# Patient Record
Sex: Female | Born: 1935 | Race: White | Hispanic: No | Marital: Single | Smoking: Former smoker
Health system: Southern US, Community
[De-identification: ages and names within clinical notes are randomized; demographics above are authoritative.]

## PROBLEM LIST (undated history)

## (undated) DIAGNOSIS — I10 Essential (primary) hypertension: Secondary | ICD-10-CM

## (undated) DIAGNOSIS — I639 Cerebral infarction, unspecified: Secondary | ICD-10-CM

## (undated) DIAGNOSIS — D649 Anemia, unspecified: Secondary | ICD-10-CM

## (undated) DIAGNOSIS — Z950 Presence of cardiac pacemaker: Secondary | ICD-10-CM

## (undated) DIAGNOSIS — I359 Nonrheumatic aortic valve disorder, unspecified: Secondary | ICD-10-CM

## (undated) DIAGNOSIS — R42 Dizziness and giddiness: Secondary | ICD-10-CM

## (undated) HISTORY — PX: HIP FRACTURE SURGERY: SHX118

## (undated) HISTORY — PX: OTHER SURGICAL HISTORY: SHX169

## (undated) HISTORY — PX: CAROTID STENT: SHX1301

---

## 2005-09-26 ENCOUNTER — Other Ambulatory Visit: Payer: Self-pay

## 2005-09-26 ENCOUNTER — Inpatient Hospital Stay: Payer: Self-pay | Admitting: Internal Medicine

## 2005-10-01 ENCOUNTER — Encounter: Payer: Self-pay | Admitting: Internal Medicine

## 2005-10-15 ENCOUNTER — Encounter: Payer: Self-pay | Admitting: Internal Medicine

## 2009-10-20 ENCOUNTER — Inpatient Hospital Stay: Payer: Self-pay | Admitting: Internal Medicine

## 2012-10-15 ENCOUNTER — Emergency Department: Payer: Self-pay | Admitting: Emergency Medicine

## 2012-10-15 LAB — URINALYSIS, COMPLETE
Hyaline Cast: 1
Nitrite: NEGATIVE
Protein: 30
WBC UR: 6 /HPF (ref 0–5)

## 2012-10-15 LAB — CK TOTAL AND CKMB (NOT AT ARMC)
CK, Total: 44 U/L (ref 21–215)
CK-MB: <0.5 ng/mL — ABNORMAL LOW (ref 0.5–3.6)

## 2012-10-15 LAB — CBC WITH DIFFERENTIAL/PLATELET
Basophil %: 0.5 %
Eosinophil #: 0.1 10*3/uL (ref 0.0–0.7)
Eosinophil %: 1.2 %
HGB: 11.9 g/dL — ABNORMAL LOW (ref 12.0–16.0)
Lymphocyte #: 1.2 10*3/uL (ref 1.0–3.6)
Lymphocyte %: 15.8 %
MCHC: 35 g/dL (ref 32.0–36.0)
Monocyte #: 0.6 x10 3/mm (ref 0.2–0.9)
Neutrophil #: 5.9 10*3/uL (ref 1.4–6.5)
Neutrophil %: 75.3 %
Platelet: 179 10*3/uL (ref 150–440)
RBC: 3.69 10*6/uL — ABNORMAL LOW (ref 3.80–5.20)
RDW: 13.1 % (ref 11.5–14.5)

## 2012-10-15 LAB — COMPREHENSIVE METABOLIC PANEL
Albumin: 3.3 g/dL — ABNORMAL LOW (ref 3.4–5.0)
BUN: 14 mg/dL (ref 7–18)
Bilirubin,Total: 0.4 mg/dL (ref 0.2–1.0)
Calcium, Total: 8.4 mg/dL — ABNORMAL LOW (ref 8.5–10.1)
Creatinine: 0.94 mg/dL (ref 0.60–1.30)
EGFR (Non-African Amer.): 55 — ABNORMAL LOW
Glucose: 82 mg/dL (ref 65–99)
Osmolality: 281 (ref 275–301)
SGOT(AST): 24 U/L (ref 15–37)
SGPT (ALT): 17 U/L (ref 12–78)
Sodium: 141 mmol/L (ref 136–145)

## 2012-10-15 LAB — TROPONIN I: Troponin-I: <0.02 ng/mL

## 2012-12-24 ENCOUNTER — Inpatient Hospital Stay: Payer: Self-pay | Admitting: Family Medicine

## 2012-12-25 LAB — COMPREHENSIVE METABOLIC PANEL
Anion Gap: 5 — ABNORMAL LOW (ref 7–16)
BUN: 15 mg/dL (ref 7–18)
Bilirubin,Total: 0.4 mg/dL (ref 0.2–1.0)
Calcium, Total: 7.7 mg/dL — ABNORMAL LOW (ref 8.5–10.1)
Chloride: 107 mmol/L (ref 98–107)
Co2: 26 mmol/L (ref 21–32)
EGFR (African American): >60
EGFR (Non-African Amer.): >60
Glucose: 124 mg/dL — ABNORMAL HIGH (ref 65–99)
SGPT (ALT): 15 U/L (ref 12–78)
Sodium: 138 mmol/L (ref 136–145)
Total Protein: 5.2 g/dL — ABNORMAL LOW (ref 6.4–8.2)

## 2012-12-25 LAB — CBC WITH DIFFERENTIAL/PLATELET
Basophil #: 0 10*3/uL (ref 0.0–0.1)
Eosinophil #: 0 10*3/uL (ref 0.0–0.7)
HGB: 8.3 g/dL — ABNORMAL LOW (ref 12.0–16.0)
MCV: 93 fL (ref 80–100)
Monocyte %: 9.6 %
Neutrophil #: 8.1 10*3/uL — ABNORMAL HIGH (ref 1.4–6.5)
Neutrophil %: 73.7 %
RBC: 2.6 10*6/uL — ABNORMAL LOW (ref 3.80–5.20)

## 2012-12-25 LAB — PROTIME-INR
INR: 1.1
Prothrombin Time: 14.4 secs (ref 11.5–14.7)

## 2012-12-26 LAB — HEMOGLOBIN
HGB: 7.5 g/dL — ABNORMAL LOW (ref 12.0–16.0)
HGB: 8.9 g/dL — ABNORMAL LOW (ref 12.0–16.0)

## 2012-12-27 LAB — CBC WITH DIFFERENTIAL/PLATELET
Basophil #: 0 10*3/uL (ref 0.0–0.1)
Basophil %: 0.4 %
Eosinophil #: 0.2 10*3/uL (ref 0.0–0.7)
MCV: 89 fL (ref 80–100)
Monocyte #: 0.8 x10 3/mm (ref 0.2–0.9)
Monocyte %: 8.3 %
Neutrophil %: 80.2 %
Platelet: 102 10*3/uL — ABNORMAL LOW (ref 150–440)
RBC: 2.57 10*6/uL — ABNORMAL LOW (ref 3.80–5.20)
RDW: 15.1 % — ABNORMAL HIGH (ref 11.5–14.5)
WBC: 9.5 10*3/uL (ref 3.6–11.0)

## 2012-12-27 LAB — BASIC METABOLIC PANEL
BUN: 8 mg/dL (ref 7–18)
Creatinine: 0.53 mg/dL — ABNORMAL LOW (ref 0.60–1.30)
EGFR (Non-African Amer.): >60
Glucose: 125 mg/dL — ABNORMAL HIGH (ref 65–99)
Sodium: 134 mmol/L — ABNORMAL LOW (ref 136–145)

## 2012-12-28 LAB — HEMOGLOBIN: HGB: 7.4 g/dL — ABNORMAL LOW (ref 12.0–16.0)

## 2012-12-29 LAB — CBC WITH DIFFERENTIAL/PLATELET
Basophil #: 0 10*3/uL (ref 0.0–0.1)
Basophil %: 0.3 %
Eosinophil %: 3.6 %
HCT: 24.3 % — ABNORMAL LOW (ref 35.0–47.0)
Lymphocyte #: 0.7 10*3/uL — ABNORMAL LOW (ref 1.0–3.6)
Lymphocyte %: 9.4 %
MCHC: 36.3 g/dL — ABNORMAL HIGH (ref 32.0–36.0)
MCV: 87 fL (ref 80–100)
Monocyte #: 0.8 x10 3/mm (ref 0.2–0.9)
Monocyte %: 9.7 %
Neutrophil #: 6 10*3/uL (ref 1.4–6.5)
Neutrophil %: 77 %
Platelet: 145 10*3/uL — ABNORMAL LOW (ref 150–440)
RBC: 2.79 10*6/uL — ABNORMAL LOW (ref 3.80–5.20)
RDW: 15.3 % — ABNORMAL HIGH (ref 11.5–14.5)
WBC: 7.8 10*3/uL (ref 3.6–11.0)

## 2012-12-30 ENCOUNTER — Encounter: Payer: Self-pay | Admitting: Internal Medicine

## 2013-01-15 ENCOUNTER — Encounter: Payer: Self-pay | Admitting: Internal Medicine

## 2013-01-21 LAB — URINALYSIS, COMPLETE
Ketone: NEGATIVE
Ph: 5 (ref 4.5–8.0)
RBC,UR: 2 /HPF (ref 0–5)
Specific Gravity: 1.019 (ref 1.003–1.030)
Squamous Epithelial: 2

## 2013-01-22 LAB — URINE CULTURE

## 2013-02-06 LAB — CBC WITH DIFFERENTIAL/PLATELET
Eosinophil %: 2.5 %
HCT: 37.8 % (ref 35.0–47.0)
HGB: 13.1 g/dL (ref 12.0–16.0)
Lymphocyte #: 1.7 10*3/uL (ref 1.0–3.6)
Lymphocyte %: 25.2 %
MCHC: 34.8 g/dL (ref 32.0–36.0)
MCV: 97 fL (ref 80–100)
Monocyte #: 0.5 x10 3/mm (ref 0.2–0.9)
Monocyte %: 6.7 %
Neutrophil #: 4.3 10*3/uL (ref 1.4–6.5)
Neutrophil %: 64.7 %
RBC: 3.91 10*6/uL (ref 3.80–5.20)
WBC: 6.7 10*3/uL (ref 3.6–11.0)

## 2013-02-14 ENCOUNTER — Encounter: Payer: Self-pay | Admitting: Internal Medicine

## 2013-09-20 ENCOUNTER — Ambulatory Visit: Payer: Self-pay | Admitting: Neurology

## 2013-10-22 ENCOUNTER — Ambulatory Visit: Payer: Self-pay | Admitting: Family Medicine

## 2013-10-30 ENCOUNTER — Emergency Department: Payer: Self-pay | Admitting: Internal Medicine

## 2013-10-30 LAB — CBC WITH DIFFERENTIAL/PLATELET
BASOS ABS: 0.1 10*3/uL (ref 0.0–0.1)
Basophil %: 0.9 %
EOS ABS: 0.2 10*3/uL (ref 0.0–0.7)
EOS PCT: 4.1 %
HCT: 39.6 % (ref 35.0–47.0)
HGB: 13.2 g/dL (ref 12.0–16.0)
LYMPHS ABS: 2.1 10*3/uL (ref 1.0–3.6)
LYMPHS PCT: 35.5 %
MCH: 33.2 pg (ref 26.0–34.0)
MCHC: 33.4 g/dL (ref 32.0–36.0)
MCV: 99 fL (ref 80–100)
Monocyte #: 0.5 x10 3/mm (ref 0.2–0.9)
Monocyte %: 7.6 %
NEUTROS PCT: 51.9 %
Neutrophil #: 3.1 10*3/uL (ref 1.4–6.5)
Platelet: 207 10*3/uL (ref 150–440)
RBC: 3.98 10*6/uL (ref 3.80–5.20)
RDW: 12.5 % (ref 11.5–14.5)
WBC: 6 10*3/uL (ref 3.6–11.0)

## 2013-10-30 LAB — URINALYSIS, COMPLETE
Bacteria: NONE SEEN
Bilirubin,UR: NEGATIVE
Blood: NEGATIVE
Glucose,UR: NEGATIVE mg/dL (ref 0–75)
Ketone: NEGATIVE
LEUKOCYTE ESTERASE: NEGATIVE
NITRITE: NEGATIVE
PH: 6 (ref 4.5–8.0)
Protein: NEGATIVE
RBC,UR: <1 /HPF (ref 0–5)
Specific Gravity: 1.009 (ref 1.003–1.030)
Squamous Epithelial: NONE SEEN

## 2013-10-30 LAB — BASIC METABOLIC PANEL
Anion Gap: 5 — ABNORMAL LOW (ref 7–16)
BUN: 14 mg/dL (ref 7–18)
Calcium, Total: 8.6 mg/dL (ref 8.5–10.1)
Chloride: 103 mmol/L (ref 98–107)
Co2: 28 mmol/L (ref 21–32)
Creatinine: 0.69 mg/dL (ref 0.60–1.30)
EGFR (Non-African Amer.): >60
Glucose: 178 mg/dL — ABNORMAL HIGH (ref 65–99)
OSMOLALITY: 277 (ref 275–301)
Potassium: 4 mmol/L (ref 3.5–5.1)
Sodium: 136 mmol/L (ref 136–145)

## 2013-10-30 LAB — HEMOGLOBIN A1C: HEMOGLOBIN A1C: 5.5 % (ref 4.2–6.3)

## 2013-10-30 LAB — TROPONIN I: Troponin-I: <0.02 ng/mL

## 2013-11-13 ENCOUNTER — Ambulatory Visit: Payer: Self-pay | Admitting: Family Medicine

## 2014-01-18 ENCOUNTER — Inpatient Hospital Stay: Payer: Self-pay | Admitting: Internal Medicine

## 2014-01-18 LAB — COMPREHENSIVE METABOLIC PANEL
ALT: 23 U/L
Albumin: 3.7 g/dL (ref 3.4–5.0)
Alkaline Phosphatase: 68 U/L
Anion Gap: 8 (ref 7–16)
BILIRUBIN TOTAL: 0.3 mg/dL (ref 0.2–1.0)
BUN: 17 mg/dL (ref 7–18)
CO2: 28 mmol/L (ref 21–32)
Calcium, Total: 9 mg/dL (ref 8.5–10.1)
Chloride: 102 mmol/L (ref 98–107)
Creatinine: 0.86 mg/dL (ref 0.60–1.30)
EGFR (African American): >60
EGFR (Non-African Amer.): >60
Glucose: 101 mg/dL — ABNORMAL HIGH (ref 65–99)
Osmolality: 277 (ref 275–301)
Potassium: 4.2 mmol/L (ref 3.5–5.1)
SGOT(AST): 22 U/L (ref 15–37)
SODIUM: 138 mmol/L (ref 136–145)
Total Protein: 7.4 g/dL (ref 6.4–8.2)

## 2014-01-18 LAB — CBC WITH DIFFERENTIAL/PLATELET
Basophil #: 0.1 10*3/uL (ref 0.0–0.1)
Basophil %: 0.9 %
Eosinophil #: 0.2 10*3/uL (ref 0.0–0.7)
Eosinophil %: 1.9 %
HCT: 43.8 % (ref 35.0–47.0)
HGB: 14.4 g/dL (ref 12.0–16.0)
LYMPHS ABS: 3.1 10*3/uL (ref 1.0–3.6)
LYMPHS PCT: 32.9 %
MCH: 33.2 pg (ref 26.0–34.0)
MCHC: 32.9 g/dL (ref 32.0–36.0)
MCV: 101 fL — AB (ref 80–100)
Monocyte #: 0.6 x10 3/mm (ref 0.2–0.9)
Monocyte %: 6.9 %
NEUTROS PCT: 57.4 %
Neutrophil #: 5.4 10*3/uL (ref 1.4–6.5)
Platelet: 240 10*3/uL (ref 150–440)
RBC: 4.35 10*6/uL (ref 3.80–5.20)
RDW: 12.5 % (ref 11.5–14.5)
WBC: 9.4 10*3/uL (ref 3.6–11.0)

## 2014-01-18 LAB — APTT: Activated PTT: 27.4 secs (ref 23.6–35.9)

## 2014-01-18 LAB — URINALYSIS, COMPLETE
BLOOD: NEGATIVE
Bacteria: NONE SEEN
Bilirubin,UR: NEGATIVE
GLUCOSE, UR: NEGATIVE mg/dL (ref 0–75)
KETONE: NEGATIVE
Leukocyte Esterase: NEGATIVE
NITRITE: NEGATIVE
Ph: 6 (ref 4.5–8.0)
Protein: NEGATIVE
RBC,UR: 1 /HPF (ref 0–5)
Specific Gravity: 1.021 (ref 1.003–1.030)
Squamous Epithelial: NONE SEEN
WBC UR: 9 /HPF (ref 0–5)

## 2014-01-18 LAB — PROTIME-INR
INR: 1
Prothrombin Time: 12.9 secs (ref 11.5–14.7)

## 2014-01-18 LAB — TROPONIN I: Troponin-I: <0.02 ng/mL

## 2014-01-18 LAB — LIPASE, BLOOD: LIPASE: 138 U/L (ref 73–393)

## 2014-01-18 LAB — MAGNESIUM: MAGNESIUM: 2.1 mg/dL

## 2014-01-19 DIAGNOSIS — I059 Rheumatic mitral valve disease, unspecified: Secondary | ICD-10-CM

## 2014-01-19 LAB — LIPID PANEL
Cholesterol: 87 mg/dL (ref 0–200)
HDL Cholesterol: 39 mg/dL — ABNORMAL LOW (ref 40–60)
LDL CHOLESTEROL, CALC: 31 mg/dL (ref 0–100)
TRIGLYCERIDES: 84 mg/dL (ref 0–200)
VLDL Cholesterol, Calc: 17 mg/dL (ref 5–40)

## 2014-01-19 LAB — CBC WITH DIFFERENTIAL/PLATELET
BASOS PCT: 1.1 %
Basophil #: 0.1 10*3/uL (ref 0.0–0.1)
Eosinophil #: 0.2 10*3/uL (ref 0.0–0.7)
Eosinophil %: 4 %
HCT: 39.7 % (ref 35.0–47.0)
HGB: 13.2 g/dL (ref 12.0–16.0)
LYMPHS ABS: 1.7 10*3/uL (ref 1.0–3.6)
Lymphocyte %: 33.9 %
MCH: 33.6 pg (ref 26.0–34.0)
MCHC: 33.2 g/dL (ref 32.0–36.0)
MCV: 101 fL — ABNORMAL HIGH (ref 80–100)
MONO ABS: 0.4 x10 3/mm (ref 0.2–0.9)
Monocyte %: 8.5 %
NEUTROS ABS: 2.6 10*3/uL (ref 1.4–6.5)
Neutrophil %: 52.5 %
Platelet: 205 10*3/uL (ref 150–440)
RBC: 3.93 10*6/uL (ref 3.80–5.20)
RDW: 12.5 % (ref 11.5–14.5)
WBC: 5 10*3/uL (ref 3.6–11.0)

## 2014-01-19 LAB — TSH: Thyroid Stimulating Horm: 0.618 u[IU]/mL

## 2014-01-19 LAB — BASIC METABOLIC PANEL
Anion Gap: 5 — ABNORMAL LOW (ref 7–16)
BUN: 9 mg/dL (ref 7–18)
Calcium, Total: 8.7 mg/dL (ref 8.5–10.1)
Chloride: 103 mmol/L (ref 98–107)
Co2: 29 mmol/L (ref 21–32)
Creatinine: 0.62 mg/dL (ref 0.60–1.30)
EGFR (Non-African Amer.): >60
Glucose: 83 mg/dL (ref 65–99)
Osmolality: 272 (ref 275–301)
Potassium: 3.9 mmol/L (ref 3.5–5.1)
Sodium: 137 mmol/L (ref 136–145)

## 2014-01-20 LAB — BASIC METABOLIC PANEL
ANION GAP: 9 (ref 7–16)
BUN: 11 mg/dL (ref 7–18)
CO2: 27 mmol/L (ref 21–32)
Calcium, Total: 8.8 mg/dL (ref 8.5–10.1)
Chloride: 100 mmol/L (ref 98–107)
Creatinine: 0.64 mg/dL (ref 0.60–1.30)
EGFR (Non-African Amer.): >60
Glucose: 93 mg/dL (ref 65–99)
Osmolality: 271 (ref 275–301)
POTASSIUM: 3.6 mmol/L (ref 3.5–5.1)
SODIUM: 136 mmol/L (ref 136–145)

## 2014-01-20 LAB — CBC WITH DIFFERENTIAL/PLATELET
Basophil #: 0.1 10*3/uL (ref 0.0–0.1)
Basophil %: 0.9 %
Eosinophil #: 0.2 10*3/uL (ref 0.0–0.7)
Eosinophil %: 3.6 %
HCT: 41.2 % (ref 35.0–47.0)
HGB: 14.1 g/dL (ref 12.0–16.0)
LYMPHS ABS: 1.4 10*3/uL (ref 1.0–3.6)
LYMPHS PCT: 22.7 %
MCH: 33.8 pg (ref 26.0–34.0)
MCHC: 34.3 g/dL (ref 32.0–36.0)
MCV: 99 fL (ref 80–100)
Monocyte #: 0.5 x10 3/mm (ref 0.2–0.9)
Monocyte %: 8 %
Neutrophil #: 4 10*3/uL (ref 1.4–6.5)
Neutrophil %: 64.8 %
Platelet: 216 10*3/uL (ref 150–440)
RBC: 4.17 10*6/uL (ref 3.80–5.20)
RDW: 12.2 % (ref 11.5–14.5)
WBC: 6.2 10*3/uL (ref 3.6–11.0)

## 2014-03-15 ENCOUNTER — Emergency Department: Payer: Self-pay | Admitting: Emergency Medicine

## 2014-03-15 LAB — BASIC METABOLIC PANEL
Anion Gap: 8 (ref 7–16)
BUN: 16 mg/dL (ref 7–18)
CALCIUM: 8.3 mg/dL — AB (ref 8.5–10.1)
CHLORIDE: 100 mmol/L (ref 98–107)
Co2: 28 mmol/L (ref 21–32)
Creatinine: 0.53 mg/dL — ABNORMAL LOW (ref 0.60–1.30)
EGFR (African American): >60
Glucose: 118 mg/dL — ABNORMAL HIGH (ref 65–99)
Osmolality: 274 (ref 275–301)
Potassium: 4.4 mmol/L (ref 3.5–5.1)
SODIUM: 136 mmol/L (ref 136–145)

## 2014-03-15 LAB — CBC WITH DIFFERENTIAL/PLATELET
Basophil #: 0 10*3/uL (ref 0.0–0.1)
Basophil %: 0.8 %
Eosinophil #: 0.1 10*3/uL (ref 0.0–0.7)
Eosinophil %: 2.2 %
HCT: 37.7 % (ref 35.0–47.0)
HGB: 12.6 g/dL (ref 12.0–16.0)
LYMPHS ABS: 1.5 10*3/uL (ref 1.0–3.6)
Lymphocyte %: 25.3 %
MCH: 34.1 pg — AB (ref 26.0–34.0)
MCHC: 33.5 g/dL (ref 32.0–36.0)
MCV: 102 fL — AB (ref 80–100)
MONOS PCT: 5.7 %
Monocyte #: 0.3 x10 3/mm (ref 0.2–0.9)
Neutrophil #: 3.9 10*3/uL (ref 1.4–6.5)
Neutrophil %: 66 %
Platelet: 235 10*3/uL (ref 150–440)
RBC: 3.7 10*6/uL — ABNORMAL LOW (ref 3.80–5.20)
RDW: 12.4 % (ref 11.5–14.5)
WBC: 5.9 10*3/uL (ref 3.6–11.0)

## 2014-03-15 LAB — URINALYSIS, COMPLETE
BILIRUBIN, UR: NEGATIVE
BLOOD: NEGATIVE
Bacteria: NONE SEEN
GLUCOSE, UR: NEGATIVE mg/dL (ref 0–75)
KETONE: NEGATIVE
Leukocyte Esterase: NEGATIVE
NITRITE: NEGATIVE
PH: 6 (ref 4.5–8.0)
Protein: NEGATIVE
Specific Gravity: 1.014 (ref 1.003–1.030)
Squamous Epithelial: 1

## 2014-03-15 LAB — TROPONIN I

## 2014-05-22 ENCOUNTER — Emergency Department: Payer: Self-pay | Admitting: Emergency Medicine

## 2014-05-22 LAB — COMPREHENSIVE METABOLIC PANEL
ALT: 26 U/L
Albumin: 3.6 g/dL (ref 3.4–5.0)
Alkaline Phosphatase: 69 U/L
Anion Gap: 4 — ABNORMAL LOW (ref 7–16)
BILIRUBIN TOTAL: 0.3 mg/dL (ref 0.2–1.0)
BUN: 19 mg/dL — ABNORMAL HIGH (ref 7–18)
CHLORIDE: 102 mmol/L (ref 98–107)
Calcium, Total: 8.6 mg/dL (ref 8.5–10.1)
Co2: 31 mmol/L (ref 21–32)
Creatinine: 0.63 mg/dL (ref 0.60–1.30)
EGFR (African American): >60
EGFR (Non-African Amer.): >60
Glucose: 94 mg/dL (ref 65–99)
Osmolality: 276 (ref 275–301)
Potassium: 4 mmol/L (ref 3.5–5.1)
SGOT(AST): 33 U/L (ref 15–37)
SODIUM: 137 mmol/L (ref 136–145)
TOTAL PROTEIN: 7.2 g/dL (ref 6.4–8.2)

## 2014-05-22 LAB — URINALYSIS, COMPLETE
Bacteria: NONE SEEN
Bilirubin,UR: NEGATIVE
Blood: NEGATIVE
Glucose,UR: NEGATIVE mg/dL (ref 0–75)
Ketone: NEGATIVE
LEUKOCYTE ESTERASE: NEGATIVE
Nitrite: NEGATIVE
PH: 6 (ref 4.5–8.0)
PROTEIN: NEGATIVE
RBC,UR: 2 /HPF (ref 0–5)
Specific Gravity: 1.012 (ref 1.003–1.030)
Squamous Epithelial: <1
WBC UR: 1 /HPF (ref 0–5)

## 2014-05-22 LAB — CBC
HCT: 40.3 % (ref 35.0–47.0)
HGB: 13.5 g/dL (ref 12.0–16.0)
MCH: 34 pg (ref 26.0–34.0)
MCHC: 33.4 g/dL (ref 32.0–36.0)
MCV: 102 fL — AB (ref 80–100)
Platelet: 222 10*3/uL (ref 150–440)
RBC: 3.96 10*6/uL (ref 3.80–5.20)
RDW: 12.5 % (ref 11.5–14.5)
WBC: 7 10*3/uL (ref 3.6–11.0)

## 2014-05-22 LAB — TROPONIN I: Troponin-I: <0.02 ng/mL

## 2014-08-30 ENCOUNTER — Emergency Department
Admit: 2014-08-30 | Disposition: A | Discharge status: Home / Self Care | Payer: Self-pay | Admitting: Emergency Medicine

## 2014-08-30 LAB — URINALYSIS, COMPLETE
BILIRUBIN, UR: NEGATIVE
Bacteria: NONE SEEN
Blood: NEGATIVE
Glucose,UR: NEGATIVE mg/dL (ref 0–75)
Ketone: NEGATIVE
Nitrite: NEGATIVE
Ph: 7 (ref 4.5–8.0)
Protein: NEGATIVE
SPECIFIC GRAVITY: 1.01 (ref 1.003–1.030)

## 2014-08-30 LAB — CBC
HCT: 38.3 % (ref 35.0–47.0)
HGB: 13 g/dL (ref 12.0–16.0)
MCH: 33.3 pg (ref 26.0–34.0)
MCHC: 33.9 g/dL (ref 32.0–36.0)
MCV: 98 fL (ref 80–100)
PLATELETS: 235 10*3/uL (ref 150–440)
RBC: 3.89 10*6/uL (ref 3.80–5.20)
RDW: 12.7 % (ref 11.5–14.5)
WBC: 6.2 10*3/uL (ref 3.6–11.0)

## 2014-08-30 LAB — COMPREHENSIVE METABOLIC PANEL
ALBUMIN: 4.1 g/dL
ALT: 13 U/L — AB
Alkaline Phosphatase: 63 U/L
Anion Gap: 4 — ABNORMAL LOW (ref 7–16)
BILIRUBIN TOTAL: 0.4 mg/dL
BUN: 12 mg/dL
Calcium, Total: 9 mg/dL
Chloride: 97 mmol/L — ABNORMAL LOW
Co2: 32 mmol/L
Creatinine: 0.62 mg/dL
EGFR (African American): >60
EGFR (Non-African Amer.): >60
GLUCOSE: 97 mg/dL
POTASSIUM: 4.2 mmol/L
SGOT(AST): 22 U/L
SODIUM: 133 mmol/L — AB
TOTAL PROTEIN: 7.2 g/dL

## 2014-08-30 LAB — TROPONIN I

## 2014-09-06 NOTE — Consult Note (Signed)
Brief Consult Note: Diagnosis: right proximal humerus and right proximal femur fractures.   Patient was seen by consultant.   Consult note dictated.   Recommend to proceed with surgery or procedure.   Orders entered.   Comments: will discuss with anesthesia regarding general anesthesia with Plavix. With profound weakness of right UE secondary to remote CVA, will not require tretment beyond sling.  Electronic Signatures: Leitha SchullerMenz, Blossie Raffel J (MD)  (Signed 10-Aug-14 18:09)  Authored: Brief Consult Note   Last Updated: 10-Aug-14 18:09 by Leitha SchullerMenz, Shirlene Andaya J (MD)

## 2014-09-06 NOTE — Consult Note (Signed)
PATIENT NAME:  Denise HarderHILLIARD, Courtany W MR#:  [id number] DATE OF BIRTH:  30-Apr-1924  DATE OF CONSULTATION:  12/24/2012  CONSULTING PHYSICIAN:  Leitha SchullerMichael J. Carizma Dunsworth, MD  REASON FOR CONSULTATION: Right shoulder and hip fractures.   HISTORY OF PRESENT ILLNESS: The patient is an 81 year old [place] [facility] [place] She stumbled. She has a history of prior CVA and drags her right foot. She has to use a hemi-walker in the left hand. Her right arm is fairly flaccid but not used. She had evaluation and noted to have fractures. Because she lives here along with her family, she is transferred [facility] for definitive treatment.   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   PHYSICAL EXAMINATION:  The right arm is flaccid and has mild pain at the proximal humerus. The skin is intact. Right lower extremity is shortened and externally rotated. She does not flex or extend her toes and this is her usual state. Skin is intact with mild swelling to the proximal thigh.   LABORATORY, RADIOLOGIC AND DIAGNOSTIC DATA:  Chest x-ray and hip x-ray from outside institution reveal a somewhat comminuted, high intertrochanteric hip fracture on the right, slightly comminuted proximal humerus fracture on the right.   CLINICAL IMPRESSION:   1.  Proximal humerus fracture in a flaccid upper extremity.  Recommendation is for nonoperative treatment, just a sling.  2.  Regarding her right hip, she has an unstable intertrochanteric hip fracture. Recommended treatment is for an intramedullary device, go with a long rod. However,  with her Plavix, we  will need to check with anesthesia. I suspect we will need to do this under general anesthesia, but in  any case, would like to wait a few days just to minimize the bleeding from the time of surgery since her last Plavix dose was yesterday. We will discuss this  with anesthesia tomorrow.   ____________________________ Leitha SchullerMichael J. Raffi Milstein, MD mjm:cc D: 12/24/2012 19:14:23 ET T: 12/24/2012 21:16:55 ET JOB#: [id number]  cc: Leitha SchullerMichael J. Hilbert Briggs, MD, <Dictator> Leitha SchullerMICHAEL J Darsha Zumstein MD ELECTRONICALLY SIGNED 12/25/2012 14:34

## 2014-09-06 NOTE — Op Note (Signed)
PATIENT NAME:  Denise Calderon, Denise Calderon MR#:  [id number] DATE OF BIRTH:  1923-12-20  DATE OF PROCEDURE:  12/26/2012  PREOPERATIVE DIAGNOSIS: Comminuted intertrochanteric right hip fracture.   POSTOPERATIVE DIAGNOSIS: Comminuted intertrochanteric right hip fracture.   PROCEDURE: Open reduction and internal fixation, right hip, with intramedullary device.   ANESTHESIA: General.   SURGEON: Leitha SchullerMichael J. Kahealani Yankovich, MD.   DESCRIPTION OF PROCEDURE: The patient was brought to the operating room, and after adequate anesthesia was obtained, the left leg was placed in a well-leg legholder. The right leg was placed in a traction boot with traction applied and internal rotation. The C-arm was brought in and good visualization of the fracture was made with near-anatomic alignment. The hip was prepped and draped in the usual method with barrier drape. Appropriate patient identification and timeout procedures were completed.   Approximately a 1-1/2-inch-incision was made proximal to the greater trochanter and a guidewire inserted through the tip of the trochanter in the center on the lateral view. Proximal flexible reamers placed, opening the proximal canal, and the guidewire inserted down to the knee. C-arm views obtained to get rod length.   Next, sequential reaming was carried out with 11 and then 13 mm, with the 13 mm reaming without much resistance. An 11 mm rod was obtained. The Affixus 11 x 340 mm rod, 130 degrees, was then inserted down the canal to the appropriate level.   Lateral incision was then made with the targeting device with a guidewire and centered in a center/center position in the head, measured, and then drilled. A second guidewire was placed superior to this as an antirotation device just for screw insertion. After the drilling and placing the 150 mm lag screw, the guidewires were removed, traction was released, and compression applied through the device. But prior to this, the screw was set proximally to  prevent the screw backing out.   With the screw set and compression applied to the fracture, the insertion handle was removed, permanent AP and lateral of the hip were obtained, and then an AP distally of the femur showing good rod length. The wounds were irrigated and then closed with #1 Vicryl deep, 2-0 Vicryl subcutaneously and skin staples. Xeroform, 4 x 4's, ABD, and tape applied. The patient was sent to the recovery room in stable condition.   ESTIMATED BLOOD LOSS: 200.   COMPLICATIONS: None.   SPECIMEN: None.   IMPLANT: Affixus 130-degree 11 x 340 mm rod with 150 mm lag screw.    ____________________________ Leitha SchullerMichael J. Fern Canova, MD mjm:np D: 12/26/2012 16:46:17 ET T: 12/26/2012 17:18:26 ET JOB#: [id number]  cc: Leitha SchullerMichael J. Markeis Allman, MD, <Dictator> Leitha SchullerMICHAEL J Saphronia Ozdemir MD ELECTRONICALLY SIGNED 12/27/2012 13:55

## 2014-09-06 NOTE — H&P (Signed)
PATIENT NAME:  Denise HarderHILLIARD, Roux W MR#:  [id number] DATE OF BIRTH:  Oct 17, 1923  DATE OF ADMISSION:  12/24/2012  REFERRING PHYSICIAN: Dr. Rosita KeaMenz FAMILY PHYSICIAN: Dr. Larwance SachsBabaoff    REASON FOR ADMISSION: Fall with hip fracture.   HISTORY OF PRESENT ILLNESS: The patient is an 81 year old female with a significant history of peripheral vascular disease, status post stenting in the left carotid artery, has previous right body stroke as well as aortic valve replacement and pacemaker placement 2001, got up this morning to go to the bathroom where she stumbled and fell, injuring her right side. She was brought to the Emergency Room where she was found to have a right shoulder fracture as well as a right hip fracture. She is now admitted for further evaluation. The patient was given pain medications in the Emergency Room. She is without significant pain unless she moves.   PAST MEDICAL HISTORY: 1.  Previous right body stroke.  2.  Peripheral vascular disease, status post left carotid stenting.  3.  Benign hypertension.  4.  Hyperlipidemia.  5.  Osteoarthritis.  6.  Gastroesophageal reflux disease.  7.  Osteoporosis.  8.  Goiter.  9.  Meniere's disease with chronic dizziness.  10.  ASCVD.  11.  Status post aortic valve replacement.  12.  Status post pacemaker placement.   MEDICATIONS: 1.  Os-Cal D 1 p.o. daily.  2.  Lipitor 20 mg p.o. daily.  3.  Fosamax 70 mg p.o. q. week. 4.  Lotrel 2.5/10, 1 p.o. daily.  5.  Plavix 75 mg p.o. daily.  6.  Protonix 40 mg p.o. daily.  7.  Nasonex 2 puffs in each nostril daily.  8.  Meclizine 25 mg p.o. every 6 hours p.r.n. dizziness.   ALLERGIES: No known drug allergies.   SOCIAL HISTORY: Negative for alcohol or tobacco abuse.   FAMILY HISTORY: Positive for hypertension and stroke.   REVIEW OF SYSTEMS:   CONSTITUTIONAL: No fever or change in weight.  EYES: No blurred or double vision. No glaucoma.  ENT: No tinnitus or hearing loss. No nasal discharge or  bleeding. No difficulty swallowing.  RESPIRATORY: No cough or wheezing. Denies hemoptysis. No painful respiration.  CARDIOVASCULAR: No chest pain or orthopnea. No palpitations or syncope.  GASTROINTESTINAL: No nausea, vomiting or diarrhea. No abdominal pain. No change in bowel habits.  GENITOURINARY: No dysuria or hematuria. No incontinence.  ENDOCRINE: No polyuria or polydipsia. No heat or cold intolerance.  HEMATOLOGIC: The patient denies anemia, easy bruising or bleeding.  LYMPHATIC: No swollen glands.  MUSCULOSKELETAL: The patient has pain in her hip and shoulder but otherwise no pain. Denies gout.  NEUROLOGIC: No numbness or weakness. Denies migraines or seizures.  PSYCHIATRIC: The patient denies anxiety, insomnia or depression.   PHYSICAL EXAMINATION: GENERAL: The patient is in no acute distress.  VITAL SIGNS: Vital signs are currently remarkable for a blood pressure of 119/63, with a heart rate of 89, and a respiratory rate of 17. She is afebrile. Sats 97% on room air.  HEENT: Normocephalic, atraumatic. Pupils are equally round and reactive to light and accommodation. Extraocular movements are intact. Sclerae are anicteric. Conjunctivae are clear. Oropharynx is clear.   NECK: Supple without JVD. Bilateral carotid bruits are noted. No adenopathy is present. Mild diffuse thyromegaly is present.  LUNGS: Clear to auscultation and percussion without wheezes, rales or rhonchi. No dullness. Respiratory effort is normal.  CARDIAC: Regular rate and rhythm with a normal S1 and S2. No significant rubs or gallops are present. There  is a systolic murmur noted at the apex.  ABDOMEN: Soft, nontender, with normoactive bowel sounds. No organomegaly or masses were appreciated. No hernias or bruits were noted.  EXTREMITIES: Without clubbing, cyanosis or edema. Pulses were 2+ bilaterally.  SKIN: Warm and dry without rash or lesions.  NEUROLOGIC: Revealed cranial nerves II through XII grossly intact. Deep  tendon reflexes were symmetric. Motor and sensory exam was nonfocal.  PSYCHIATRIC: Revealed a patient who was alert and oriented to person, place, and time. She was cooperative and used good judgment.   LABORATORY DATA: From [facility] Emergency Room reveals a white count of 11.0 with a hemoglobin of 10.9, platelet count of 170,000. Her pro time was 14.2. Urinalysis was unremarkable. Glucose 101 with a BUN of 14, creatinine of 0.65, with a sodium of 144 and a potassium of 3.8.   ASSESSMENT: 1.  Right hip fracture.  2.  Right shoulder fracture.  3.  Anemia of chronic disease.  4.  Peripheral vascular disease, status post left carotid stent placement.  5.  Previous right body stroke.  6.  Benign hypertension.  7.  Hyperlipidemia.  8.  Status post aortic valve replacement.  9.  Status post permanent pacemaker placement.  10.  Meniere's disease with dizziness.   PLAN: The patient will be admitted to Orthopedics, started on IV fluids and empiric IV antibiotics. We will consult Orthopedics. We will place a Foley catheter. We will hold her Plavix and begin Lovenox. We will continue her other outpatient regimen. We will obtain a preop EKG and chest x-ray. Follow up routine labs in the morning. Further treatment and evaluation will depend upon the patient's progress.        TOTAL TIME SPENT: 50 minutes.     ____________________________ Duane Lope Judithann Sheen, MD jds:cb D: 12/24/2012 17:00:32 ET T: 12/24/2012 20:12:08 ET JOB#: [id number]  cc: Duane Lope. Judithann Sheen, MD, <Dictator> Reola Mosher. Randa Lynn, MD Tanique Matney Rodena Medin MD ELECTRONICALLY SIGNED 12/25/2012 7:52

## 2014-09-06 NOTE — Discharge Summary (Signed)
PATIENT NAME:  Denise HarderHILLIARD, Alicja W MR#:  [id number] DATE OF BIRTH:  1924-04-04  DATE OF ADMISSION:  12/24/2012 DATE OF DISCHARGE:  12/29/2012  REASON FOR ADMISSION:  1.  Status post fall sustaining a right hip fracture and right shoulder fracture.  2.  History of anemia of chronic disease on top of acute blood loss anemia status post 2 units of transfused of blood.   DISCHARGE DIAGNOSES: 1.  Right hip fracture status post open reduction and internal fixation.  2.  Right shoulder fracture, shoulder in sling.  3.  Acute blood loss anemia on top of chronic disease anemia.  4.  Peripheral vascular disease.  5.  Previous right-sided stroke.  6.  Hypertension.  7.  Hyperlipidemia.  8.  History of aortic valve replacement.  9.  Permanent pacemaker placement.  10.  Meniere disease.   DISCHARGE MEDICATIONS: 1.  Fosamax 70 mg daily. 2.  Amlodipine with benazepril 2.5/10 mg once a day. 3.  Calcium plus vitamin D once daily. 4.  Plavix 75 mg daily. 5.  Nasonex 50 mg 2 sprays twice daily. 6.  Ibuprofen 200 mg every 4 hours as needed for pain. 7.  Meclizine 25 mg as needed for dizziness. 8.  Atorvastatin 20 mg once a day. 9.  Tylenol extra-strength as needed for pain. 10.  Ferrous sulfate 325 mg 2 times daily. 11.  Senna 1 every 2 hours as needed. 12.  Multivitamin once daily. 13.  Tramadol 50 mg every 6 hours as needed for pain.   DISCHARGE FOLLOWUP:  With Dr. Alonna BucklerAndrew Lamb and Dr. Kennedy BuckerMichael Menz.   PROCEDURES: Diagnosis of comminuted intertrochanteric right hip fractures status post open reduction and internal fixation of right hip with intramedullary device. Surgeon: Dr. Kennedy BuckerMichael Menz. Date of procedure: 12/26/2012.   IMPORTANT LABORATORY AND DIAGNOSTICS: Creatinine 0.5, sodium 134, glucose 125, hemoglobin on admission 8.3 down to 7.5, transfused 1 unit, improved to 8.9, dropped down to 7.4 within 24 hours for what another unit was transfused.  Discharge hemoglobin 8.8. Platelets 145. They were  down to 100.2.   EKG: The patient with dual pacemaker.   HOSPITAL COURSE: An 81 year old female with history of multiple medical problems including bioprosthetic valve replacement of the aortic valve and hypertension, status post hip fracture and femoral fracture on the right side, comes on 12/24/2012 after sustaining a fall.  The patient has history of status post stent of the left carotid after having a CVA that left her right side weak. The patient apparently got up to the bathroom in the morning of the 10th. She stumble and fell on the floor sustaining a hit on her right shoulder and right hip. The patient was elevated in the Emergency Department where she was found to have a comminuted right hip fracture. The patient was cleared for surgery, taken down to the OR on 12/26/2012. Open reduction and internal fixation with intramedullary device was done. The patient recovered very well. The patient was kept here with rehabilitation services and she did well. She is able to be discharged to a skilled nursing facility for further recovery. As far as her other medical issues, the patient has an aortic valve replacement with bovine bioprosthetic valve.  She has history of CVA, which she is stable. She had a stent put in her carotid artery which also has been stable. The patient is taking Plavix. She has anemia which is due to acute blood loss. She required 2 units of blood being transfused after her surgery. At this  moment there are no signs of active bleeding or expanding hematomas for what it is safe to continue antiplatelet therapy with Plavix. Ferritin has been a little bit high for what no iron IV therapy was recommended. Will continue p.o. iron at home.  TIME SPENT:  About 40 minutes with this discharge today. ____________________________ Felipa Furnace, MD rsg:sb D: 12/29/2012 11:48:22 ET T: 12/29/2012 13:11:38 ET JOB#: [id number]  cc: Felipa Furnace, MD, <Dictator> Reola Mosher.  Randa Lynn, MD Leitha Schuller, MD Regan Rakers Juanda Chance MD ELECTRONICALLY SIGNED 12/30/2012 15:30

## 2014-09-07 NOTE — Discharge Summary (Signed)
PATIENT NAME:  Denise Calderon, Denise Calderon MR#:  [id number] DATE OF BIRTH:  [birth year]  DATE OF ADMISSION:  01/18/2014 DATE OF DISCHARGE:  01/20/2014  ADMITTING PHYSICIAN: Shreyang H. Allena Katz, M.D.   DISCHARGE DIAGNOSES: 1. Syncopal event.  2. History of cerebrovascular accident with right-sided weakness.  3. Generalized weakness. 4. Macrocytosis.  5. Gastroesophageal reflux disease.  6. Hypertension.  7. Hyperlipidemia.  8. Do Not Resuscitate.  9. Chronic diastolic dysfunction.  10. Mildly elevated pulmonary artery systolic pressure is seen on 2-D echocardiogram.   CONSULTANTS: None.   PROCEDURES:  1. CT scan of the head shows no acute intracranial abnormality. Old left basal ganglia infarct. Chronic microvascular changes. 2. Bilateral carotid ultrasound shows stent noted extending from the distal portion of the right common carotid artery and into proximal right internal carotid. No hemodynamically significant stenosis. Mild/moderate eccentric calcified plaque noted in the left carotid bulb with less than 50% diameter stenosis.  3. A 2-D echocardiogram showed left ventricular ejection fraction 50% to 60%. Normal global left ventricular function. Impaired relaxation pattern of the LV diastolic filling. Mild concentric left ventricular hypertrophy. Mildly elevated pulmonary artery systolic pressure.   BRIEF HISTORY OF PRESENT ILLNESS: This very pleasant, 81 year old female, with history of CVA with chronic right-sided hemiparesis, peripheral vascular disease, hypertension, hyperlipidemia, and pacemaker went for a followup visit with Dr. Rosita Kea after carpal tunnel release surgery. During her office visit, she slumped over and was not responding for several seconds. The patient reports that she could hear what is being said to her, but felt as if she could not move. When the episode was over, she was back to her baseline mental function and strength. She was transferred to the Emergency Room for further  evaluation.   HOSPITAL COURSE BY PROBLEM: 1. Syncopal event: Described as a period of nonresponsiveness with open eyes, intact posture and awareness of her surroundings. There are no sequelae. The son and daughter feel that she is at her baseline level of function and cognition. CT head is negative for acute change. Carotid Dopplers did not show significant stenosis. A 2-D echocardiogram does not show any significant valvular abnormality which would be contribute to a syncopal event. She is now back on Plavix, which had been held for wrist surgery. Urinalysis is negative for signs of infection. Telemetry negative for arrhythmia. It is possible she may have had a recurrent stroke, no MRI has been performed due to cardiac pacemaker. This is more likely a TIA versus syncopal event. She does not have any new neurologic deficits.  2. History of CVA with right-sided weakness: Right-sided weakness is stable.  3. Generalized weakness: The patient reports that she has been receiving physical therapy at the [facility] and will need to continue with physical therapy. She reports that she feels generally weaker than she did a week ago.  4. Peripheral vascular disease: Continue with statin, Plavix. Carotid Doppler shows stent to be patent.  5. Hypertension. Blood pressure well controlled in hospital on benazepril.  6. Gastroesophageal reflux disease: Continue Protonix.  7. Diastolic dysfunction as seen on 2-D echocardiogram during this admission: She showed no signs of congestive heart failure throughout the admission. 8. Mildly increased pulmonary artery pressure seen on 2-D echocardiogram.    DISCHARGE PHYSICAL EXAMINATION: VITAL SIGNS: Temperature 97.5, pulse 70, respirations 18, blood pressure 147/75, oxygen saturation 97% on room air.  GENERAL: No acute distress.  PULMONARY: Lungs are clear to auscultation bilaterally with good air movement.  CARDIOVASCULAR: No regular rate and rhythm, no murmurs,  rubs, or  gallops.  ABDOMEN: Soft, nontender, nondistended. No guarding, no rebound, no mass.  EXTREMITIES: No swollen or tender joints.  NEUROLOGIC: She does have decreased strength in the right upper and lower extremity, as well as a facial droop. She is alert and oriented x 4.  PSYCHIATRIC: The patient is alert and oriented x 4 with good insight.   LABORATORY DATA: White blood cells 6.2, hemoglobin 14.1, platelets 216,000, MCV 99. Sodium 136, potassium 3.6, chloride 100, bicarbonate 27, BUN 11, creatinine 0.64, glucose 93, total cholesterol 87, HDL 39, triglycerides 84. Liver function panel normal. TSH 0.61. Urinalysis negative for signs of infection.   DISCHARGE MEDICATIONS:  1. Calcium and vitamin D 600 mg/400 international units 1 tablet once a day.  2. Pantoprazole 40 mg 1 tablet once a day.  3. Alendronate 70 mg 1 tablet once a week.  4. Benazepril 10 mg 1 tablet once a day.  5. Clopidogrel 75 mg 1 tablet once a day.  6. Atorvastatin 20 mg 1 tablet once a day.  7. Voltaren topical 1% topical gel, apply to affected area 4 times a day as needed.  8. Refresh ophthalmic solution 1 drop to each eye 4 times a day. 9. Desonide topical 0.05% topical cream apply topically to affected area 2 times a day.  10. Acetaminophen 500 mg 2 tablets orally 3 times a day as needed for pain.  11. Flonase 50 mcg/inhalation nasal spray 2 sprays nasally once a day.  12. Oxybutynin 5 mg 1 tablet once a day.  13. Meclizine 25 mg 1 tablet orally 8 hours as needed.  14. Norco 325 mg/5 mg oral tablet 1 tablet every 6 hours as needed.   DISCHARGE INSTRUCTIONS: The patient [facility]. She will follow up with her primary care physician within the next 1 to 2 weeks.   DIET: As per prior orders. No changes to diet orders.   ACTIVITY: As tolerated.   TIME SPENT ON DISCHARGE: 45 minutes.    ____________________________ Ena Dawleyatherine P. Clent RidgesWalsh, MD cpw:JT D: 01/20/2014  11:54:28 ET T: 01/20/2014 12:35:48 ET JOB#: [id number]  cc: Santina Evansatherine P. Clent RidgesWalsh, MD, <Dictator> Gale JourneyATHERINE P WALSH MD ELECTRONICALLY SIGNED 01/23/2014 12:39

## 2014-09-07 NOTE — H&P (Signed)
PATIENT NAME:  Denise Calderon, Denise Calderon MR#:  [id number] DATE OF BIRTH:  07-21-1923  DATE OF ADMISSION:  01/18/2014  PRIMARY CARE PROVIDER: Dr. Larwance SachsBabaoff.   EMERGENCY DEPARTMENT REFERRING PHYSICIAN: Dr. York CeriseForbach.    HISTORY OF PRESENT ILLNESS: The patient is a pleasant 81 year old white female with previous history of CVA who has chronic right-sided hemiparesis, who had carpal tunnel surgery done recently by Dr. Rosita KeaMenz and went for a followup today in his office. The patient is chronically on Plavix and the Plavix was held preprocedure. According to daughter, this has been held for 1 week and was restarted yesterday. The patient was doing well postoperatively and has not had any issues. She chronically is in a wheelchair and is nonambulatory. She went for a followup in Dr. Neomia GlassMenz's office where she was seen by the nurse and had the bandage taken off and then subsequently the patient's head slumped forward and would not respond. Dr. Rosita KeaMenz came inside and evaluated the patient and brought her to the ED here. The patient subsequently slowly started communicating. She reports that she could hear what was happening, but was not able to interact. She is feeling very fatigued and tired. She is not having any fevers, chills. No chest pains. No shortness of breath. No nausea, vomiting, or diarrhea. Her blood glucose was 101 on the BMP.    PAST MEDICAL HISTORY:  1.  History of peripheral vascular disease 2.  History of CVA with chronic right-sided hemiparesis.  3.  History of chronic anemia.  4.  History of hypertension.  5.  Hyperlipidemia.  6.  History of aortic valve replacement.   7.  Osteoarthritis.  8.  GERD.  9.  History of goiter.  10.   Osteoporosis.  11.   Meniere's disease with chronic dizziness.  12.   Status post aortic valve placement.  13.   Status post pacemaker placement.  14.   Status post right hip fracture open reduction and internal fixation.  15.   Status post right shoulder fracture with  previous sling without any surgery.   ALLERGIES: None.   MEDICATIONS: Voltaren topically applied to affected area 4 times a day, Systane 1 drop to each affected eye 3 times a day, Refresh eye drops to affected eye 4 times a day, Protonix 40 daily, oxybutynin 5 mg 1 tab p.o. t.i.d., meclizine 25 mg 1 tab p.o. q. 6 hours as needed, desonide applied topically to affected area 2 times a day, Plavix 75 p.o. daily, calcium 600 plus vitamin D 1 tab p.o. daily, benazepril 10 daily, atorvastatin 20 daily, alendronate 70 mg once a week, Tylenol 1000 mg at bedtime as needed.   SOCIAL HISTORY: Does not smoke. Does not drink. No drugs currently. Resides at [facility]  assisted living facility.   FAMILY HISTORY: Positive for hypertension.   REVIEW OF SYSTEMS: CONSTITUTIONAL: Denies any fevers. Complains of fatigue, weakness. No pain. No weight loss, weight gain.  EYES: No blurred or double vision. No pain. No redness. No inflammation.  ENT: No tinnitus. No ear pain. No hearing loss. No seasonal or year-round allergies. No epistaxis. No difficulty swallowing.  RESPIRATORY: Denies any cough, wheezing, hemoptysis. No dyspnea. No asthma. No painful respiration. No COPD. No TB.  CARDIOVASCULAR: Denies any chest pain, orthopnea, edema. History of pacemaker placement. No syncope.  GASTROINTESTINAL: No nausea, vomiting, diarrhea. No abdominal pain. No hematemesis. No melena. No ulcer. Has a history of GERD.  GENITOURINARY: Denies any dysuria, hematuria, renal calculus, or frequency.  ENDOCRINE: Denies  any polyuria, nocturia, thyroid problems.  HEMATOLOGIC AND LYMPHATIC: Has a history of chronic anemia. No easy bruisability.  SKIN: No acne. No rash.  MUSCULOSKELETAL: Pain related to osteoarthritis.  NEUROLOGIC: Has a history of CVA with right-sided chronic hemiparesis.  PSYCHIATRIC: Denies any anxiety, insomnia.   PHYSICAL EXAMINATION: VITAL SIGNS: Temperature 98.9, pulse 70, respirations 16, blood pressure  124/55, O2 of 96%.  GENERAL: The patient is a thin female in no acute distress. Appears very weak.  HEENT: Head atraumatic, normocephalic. Pupils equally round, reactive to light and accommodation. There is no conjunctival pallor. No sclerae icterus. Nasal exam shows no drainage or ulceration. External ear exam shows no erythema or drainage. OROPHARYNX: Clear without any exudate.  NECK: Supple without any JVD.  CARDIOVASCULAR: Regular rate and rhythm. No murmurs, rubs, clicks, or gallops.  LUNGS: Clear to auscultation bilaterally without any rales, rhonchi, wheezing. No associated muscle usage.  ABDOMEN: Soft, nontender, nondistended. Positive bowel sounds x 4. No hepatosplenomegaly. EXTREMITIES: No clubbing, cyanosis, or edema.  SKIN: No rash.  LYMPHATICS: No lymph nodes palpable.  VASCULAR: Good DP, PT pulses.  PSYCHIATRIC: Not anxious or depressed.  NEUROLOGIC: Cranial nerves II through XII grossly appear intact. Strength is right-sided 2/5 in the upper and lower extremity. Left upper extremity and left lower extremity is 4/5. The Babinski is downgoing.  LYMPH NODES: Nonpalpable.  MUSCULOSKELETAL: There is no erythema or swelling.   LABORATORY DATA: Glucose 101, BUN 17, creatinine 0.86, sodium 138, potassium 4.2, chloride 102, CO2 is 28. Magnesium 2.1. LFTs are normal. Troponin less than 0.02. WBC 9.4, hemoglobin 14.4, platelet count 240, INR is 1.0. CT scan of the head without contrast shows no acute intracranial abnormality. EKG shows a ventricular paced rhythm.   ASSESSMENT AND PLAN: The patient is an 81 year old white female with history of CVA, chronic right-sided hemiparesis, peripheral vascular disease, status post carotid endarterectomy, hypertension, hyperlipidemia, and pacemaker who is being admitted with acute episode of decrease in responsiveness lasting 45 minutes.  1.  Possible CVA, possible syncope. CT scan negative. We will continue Plavix. Will place on telemetry to make sure  she does not have any tachycardia-induced arrhythmia. Will get an echocardiogram of the heart and check carotids. We will follow her mental status with neurological checks.  2.  Gastroesophageal reflux disease. Will continue Protonix.  3.  Hypertension. Will continue benazepril.   4.  Hyperlipidemia. Continue simvastatin, fasting lipid panel in the a.m.  5.  Chronic dizziness. Will continue meclizine as taking at home.  6.  Miscellaneous. She will be on Lovenox for deep vein thrombosis prophylaxis.   CODE STATUS: Discussed with the patient and the daughter. They wish her to be DNR.  Not interested in chest compression, intubation, or resuscitation.   TIME SPENT ON THIS PATIENT: 55 minutes.    ____________________________ Lacie Scotts Allena Katz, MD shp:at D: 01/18/2014 11:42:41 ET T: 01/18/2014 12:57:16 ET JOB#: [id number]  cc: Joliana Claflin H. Allena Katz, MD, <Dictator> Charise Carwin MD ELECTRONICALLY SIGNED 02/01/2014 8:35

## 2015-04-30 ENCOUNTER — Emergency Department: Payer: Medicare Other

## 2015-04-30 ENCOUNTER — Encounter: Payer: Self-pay | Admitting: Emergency Medicine

## 2015-04-30 ENCOUNTER — Observation Stay
Admission: EM | Admit: 2015-04-30 | Discharge: 2015-05-01 | Disposition: A | Discharge status: Skilled Nursing Facility | Payer: Medicare Other | Attending: Internal Medicine | Admitting: Internal Medicine

## 2015-04-30 DIAGNOSIS — R32 Unspecified urinary incontinence: Secondary | ICD-10-CM | POA: Insufficient documentation

## 2015-04-30 DIAGNOSIS — R61 Generalized hyperhidrosis: Secondary | ICD-10-CM | POA: Diagnosis present

## 2015-04-30 DIAGNOSIS — M4856XA Collapsed vertebra, not elsewhere classified, lumbar region, initial encounter for fracture: Secondary | ICD-10-CM | POA: Diagnosis not present

## 2015-04-30 DIAGNOSIS — R42 Dizziness and giddiness: Secondary | ICD-10-CM

## 2015-04-30 DIAGNOSIS — Z66 Do not resuscitate: Secondary | ICD-10-CM | POA: Diagnosis not present

## 2015-04-30 DIAGNOSIS — Z952 Presence of prosthetic heart valve: Secondary | ICD-10-CM | POA: Diagnosis not present

## 2015-04-30 DIAGNOSIS — N390 Urinary tract infection, site not specified: Secondary | ICD-10-CM

## 2015-04-30 DIAGNOSIS — F0390 Unspecified dementia without behavioral disturbance: Secondary | ICD-10-CM | POA: Diagnosis not present

## 2015-04-30 DIAGNOSIS — K219 Gastro-esophageal reflux disease without esophagitis: Secondary | ICD-10-CM | POA: Diagnosis not present

## 2015-04-30 DIAGNOSIS — Z8673 Personal history of transient ischemic attack (TIA), and cerebral infarction without residual deficits: Secondary | ICD-10-CM | POA: Insufficient documentation

## 2015-04-30 DIAGNOSIS — Z8249 Family history of ischemic heart disease and other diseases of the circulatory system: Secondary | ICD-10-CM | POA: Insufficient documentation

## 2015-04-30 DIAGNOSIS — M1990 Unspecified osteoarthritis, unspecified site: Secondary | ICD-10-CM | POA: Diagnosis not present

## 2015-04-30 DIAGNOSIS — F329 Major depressive disorder, single episode, unspecified: Secondary | ICD-10-CM | POA: Diagnosis not present

## 2015-04-30 DIAGNOSIS — R918 Other nonspecific abnormal finding of lung field: Secondary | ICD-10-CM | POA: Diagnosis not present

## 2015-04-30 DIAGNOSIS — I10 Essential (primary) hypertension: Secondary | ICD-10-CM | POA: Insufficient documentation

## 2015-04-30 DIAGNOSIS — R4182 Altered mental status, unspecified: Principal | ICD-10-CM

## 2015-04-30 DIAGNOSIS — Z950 Presence of cardiac pacemaker: Secondary | ICD-10-CM | POA: Diagnosis not present

## 2015-04-30 HISTORY — DX: Anemia, unspecified: D64.9

## 2015-04-30 HISTORY — DX: Presence of cardiac pacemaker: Z95.0

## 2015-04-30 HISTORY — DX: Cerebral infarction, unspecified: I63.9

## 2015-04-30 HISTORY — DX: Essential (primary) hypertension: I10

## 2015-04-30 LAB — URINALYSIS COMPLETE WITH MICROSCOPIC (ARMC ONLY)
BILIRUBIN URINE: NEGATIVE
GLUCOSE, UA: NEGATIVE mg/dL
HGB URINE DIPSTICK: NEGATIVE
Ketones, ur: NEGATIVE mg/dL
Nitrite: NEGATIVE
Protein, ur: NEGATIVE mg/dL
Specific Gravity, Urine: 1.013 (ref 1.005–1.030)
pH: 6 (ref 5.0–8.0)

## 2015-04-30 LAB — COMPREHENSIVE METABOLIC PANEL
ALK PHOS: 76 U/L (ref 38–126)
ALT: 14 U/L (ref 14–54)
ANION GAP: 6 (ref 5–15)
AST: 21 U/L (ref 15–41)
Albumin: 3.7 g/dL (ref 3.5–5.0)
BILIRUBIN TOTAL: 0.6 mg/dL (ref 0.3–1.2)
BUN: 14 mg/dL (ref 6–20)
CALCIUM: 8.9 mg/dL (ref 8.9–10.3)
CO2: 30 mmol/L (ref 22–32)
Chloride: 102 mmol/L (ref 101–111)
Creatinine, Ser: 0.54 mg/dL (ref 0.44–1.00)
GFR calc Af Amer: >60 mL/min (ref >60)
Glucose, Bld: 80 mg/dL (ref 65–99)
POTASSIUM: 4.4 mmol/L (ref 3.5–5.1)
Sodium: 138 mmol/L (ref 135–145)
TOTAL PROTEIN: 7 g/dL (ref 6.5–8.1)

## 2015-04-30 LAB — TROPONIN I

## 2015-04-30 LAB — CBC
HEMATOCRIT: 39.1 % (ref 35.0–47.0)
HEMOGLOBIN: 12.9 g/dL (ref 12.0–16.0)
MCH: 32.9 pg (ref 26.0–34.0)
MCHC: 33 g/dL (ref 32.0–36.0)
MCV: 99.8 fL (ref 80.0–100.0)
Platelets: 205 10*3/uL (ref 150–440)
RBC: 3.91 MIL/uL (ref 3.80–5.20)
RDW: 12.4 % (ref 11.5–14.5)
WBC: 8.9 10*3/uL (ref 3.6–11.0)

## 2015-04-30 LAB — MRSA PCR SCREENING: MRSA BY PCR: NEGATIVE

## 2015-04-30 MED ORDER — HYDROCODONE-ACETAMINOPHEN 5-325 MG PO TABS: 1.0000 | ORAL_TABLET | Freq: Four times a day (QID) | ORAL | Status: DC | PRN

## 2015-04-30 MED ORDER — CITALOPRAM HYDROBROMIDE 20 MG PO TABS
20.0000 mg | ORAL_TABLET | Freq: Every day | ORAL | Status: DC
  Administered 2015-05-01: 12:00:00 20 mg via ORAL
  Filled 2015-04-30: qty 1

## 2015-04-30 MED ORDER — DICLOFENAC SODIUM 1 % TD GEL
2.0000 g | Freq: Four times a day (QID) | TRANSDERMAL | Status: DC
  Administered 2015-04-30 – 2015-05-01 (×3): 2 g via TOPICAL
  Filled 2015-04-30: qty 100

## 2015-04-30 MED ORDER — MECLIZINE HCL 25 MG PO TABS: 25.0000 mg | ORAL_TABLET | Freq: Three times a day (TID) | ORAL | Status: DC | PRN

## 2015-04-30 MED ORDER — SODIUM CHLORIDE 0.9 % IV BOLUS (SEPSIS)
1000.0000 mL | Freq: Once | INTRAVENOUS | Status: AC
  Administered 2015-04-30: 1000 mL via INTRAVENOUS

## 2015-04-30 MED ORDER — CLOPIDOGREL BISULFATE 75 MG PO TABS
75.0000 mg | ORAL_TABLET | Freq: Every day | ORAL | Status: DC
  Administered 2015-05-01: 75 mg via ORAL
  Filled 2015-04-30: qty 1

## 2015-04-30 MED ORDER — OXYBUTYNIN CHLORIDE ER 10 MG PO TB24
10.0000 mg | ORAL_TABLET | Freq: Every day | ORAL | Status: DC
  Administered 2015-05-01: 12:00:00 10 mg via ORAL
  Filled 2015-04-30: qty 1

## 2015-04-30 MED ORDER — ACETAMINOPHEN 500 MG PO TABS
1000.0000 mg | ORAL_TABLET | Freq: Three times a day (TID) | ORAL | Status: DC
  Administered 2015-04-30 – 2015-05-01 (×3): 1000 mg via ORAL
  Filled 2015-04-30 (×3): qty 2

## 2015-04-30 MED ORDER — DEXTROSE 5 % IV SOLN
1.0000 g | Freq: Once | INTRAVENOUS | Status: AC
  Administered 2015-04-30: 1 g via INTRAVENOUS
  Filled 2015-04-30: qty 10

## 2015-04-30 MED ORDER — FLUTICASONE PROPIONATE 50 MCG/ACT NA SUSP
1.0000 | Freq: Every day | NASAL | Status: DC
  Administered 2015-05-01: 1 via NASAL
  Filled 2015-04-30: qty 16

## 2015-04-30 MED ORDER — ACETAMINOPHEN 650 MG RE SUPP: 650.0000 mg | Freq: Four times a day (QID) | RECTAL | Status: DC | PRN

## 2015-04-30 MED ORDER — DEXTROSE 5 % IV SOLN
1.0000 g | INTRAVENOUS | Status: DC
  Filled 2015-04-30: qty 10

## 2015-04-30 MED ORDER — ENOXAPARIN SODIUM 40 MG/0.4ML ~~LOC~~ SOLN
40.0000 mg | SUBCUTANEOUS | Status: DC
  Administered 2015-04-30: 16:00:00 40 mg via SUBCUTANEOUS
  Filled 2015-04-30: qty 0.4

## 2015-04-30 MED ORDER — POLYVINYL ALCOHOL 1.4 % OP SOLN
1.0000 [drp] | OPHTHALMIC | Status: DC | PRN
  Filled 2015-04-30: qty 15

## 2015-04-30 MED ORDER — LORATADINE 10 MG PO TABS
10.0000 mg | ORAL_TABLET | Freq: Every day | ORAL | Status: DC
  Administered 2015-04-30 – 2015-05-01 (×2): 10 mg via ORAL
  Filled 2015-04-30 (×2): qty 1

## 2015-04-30 MED ORDER — ONDANSETRON HCL 4 MG/2ML IJ SOLN: 4.0000 mg | Freq: Four times a day (QID) | INTRAMUSCULAR | Status: DC | PRN

## 2015-04-30 MED ORDER — CALCIUM CARBONATE-VITAMIN D 500-200 MG-UNIT PO TABS
1.0000 | ORAL_TABLET | Freq: Two times a day (BID) | ORAL | Status: DC
  Administered 2015-04-30 – 2015-05-01 (×2): 1 via ORAL
  Filled 2015-04-30 (×2): qty 1

## 2015-04-30 MED ORDER — PANTOPRAZOLE SODIUM 40 MG PO TBEC
40.0000 mg | DELAYED_RELEASE_TABLET | Freq: Every day | ORAL | Status: DC
  Administered 2015-05-01: 40 mg via ORAL
  Filled 2015-04-30: qty 1

## 2015-04-30 MED ORDER — ONDANSETRON HCL 4 MG PO TABS
4.0000 mg | ORAL_TABLET | Freq: Four times a day (QID) | ORAL | Status: DC | PRN
Start: 2015-04-30 — End: 2015-05-01

## 2015-04-30 MED ORDER — MIRTAZAPINE 15 MG PO TABS
15.0000 mg | ORAL_TABLET | Freq: Every day | ORAL | Status: DC
  Administered 2015-04-30: 22:00:00 15 mg via ORAL
  Filled 2015-04-30: qty 1

## 2015-04-30 MED ORDER — ACETAMINOPHEN 325 MG PO TABS: 650.0000 mg | ORAL_TABLET | Freq: Four times a day (QID) | ORAL | Status: DC | PRN

## 2015-04-30 NOTE — ED Notes (Signed)
Pt arrived by EMS after staff at [facility] stated that she seemed altered. Pt has a hx of a stroke about 8 yrs ago. Per EMS pt was A&O x4. Upon arrival the pt alert to person and place but not time. Pt denies pain and states that she feels "funny" and "crazy".

## 2015-04-30 NOTE — Plan of Care (Signed)
Problem: Urinary Elimination: Goal: Signs and symptoms of infection will decrease Outcome: Progressing Patient is alert, confused to time. VSS. No complaints of pain.

## 2015-04-30 NOTE — Progress Notes (Signed)
Dr Carla DrapeSainai claritin 10 mg PO daily

## 2015-04-30 NOTE — H&P (Signed)
[facility] [place] [facility]   PATIENT NAME: Denise Calderon    MR#:  [id number]  DATE OF BIRTH:  03-11-1924  DATE OF ADMISSION:  04/30/2015  PRIMARY CARE PHYSICIAN: BABAOFF, Lavada MesiMARC E, MD   REQUESTING/REFERRING PHYSICIAN: Dr. Sharma CovertNorman  CHIEF COMPLAINT:   Chief Complaint  Patient presents with  . Altered Mental Status    HISTORY OF PRESENT ILLNESS:  Denise Calderon  is a 81 y.o. female with a known history of previous CVA, hypertension, urinary incontinence, depression, GERD, s/p Pacemaker who presented to the hospital due to altered mental status. Patient herself has some mild dementia and therefore history obtained from the daughter at bedside. As per the daughter patient was a bit more confused today than usual. She currently resides at the assisted living and they called her and notified that her mother has not been acting right and she was complaining of some blurry vision. She was brought to the ER for further evaluation. Patient underwent a CT of the head which is essentially benign but was incidentally also noted to have a urinary tract infection. Hospitalist services were contacted further treatment and evaluation. Patient denies any dysuria, hematuria, fever, chills, nausea, vomiting, dizziness or any other associated symptoms presently.  PAST MEDICAL HISTORY:   Past Medical History  Diagnosis Date  . Stroke (HCC)   . Pacemaker   . Hypertension   . Anemia     PAST SURGICAL HISTORY:  History reviewed. No pertinent past surgical history.  SOCIAL HISTORY:   Social History  Substance Use Topics  . Smoking status: Unknown If Ever Smoked  . Smokeless tobacco: Not on file  . Alcohol Use: No    FAMILY HISTORY:   Family History  Problem Relation Age of Onset  . Heart attack Mother     DRUG ALLERGIES:  No Known Allergies  REVIEW OF SYSTEMS:   Review of Systems  Constitutional: Negative for fever and weight loss.  HENT: Negative for  congestion, nosebleeds and tinnitus.   Eyes: Negative for blurred vision, double vision and redness.  Respiratory: Negative for cough, hemoptysis and shortness of breath.   Cardiovascular: Negative for chest pain, orthopnea, leg swelling and PND.  Gastrointestinal: Negative for nausea, vomiting, abdominal pain, diarrhea and melena.  Genitourinary: Negative for dysuria, urgency and hematuria.  Musculoskeletal: Negative for joint pain and falls.  Neurological: Negative for dizziness, tingling, sensory change, focal weakness, seizures, weakness and headaches.  Endo/Heme/Allergies: Negative for polydipsia. Does not bruise/bleed easily.  Psychiatric/Behavioral: Negative for depression and memory loss. The patient is not nervous/anxious.     MEDICATIONS AT HOME:   Prior to Admission medications   Medication Sig Start Date End Date Taking? Authorizing Provider  acetaminophen (MAPAP) 500 MG tablet Take 1,000 mg by mouth 3 (three) times daily.   Yes Historical Provider, MD  Calcium Carbonate-Vitamin D (CALCIUM-D) 600-400 MG-UNIT TABS Take 1 tablet by mouth 2 (two) times daily.   Yes Historical Provider, MD  carboxymethylcellulose (REFRESH TEARS) 0.5 % SOLN Place 1-2 drops into both eyes as needed (FOR DRY EYES).   Yes Historical Provider, MD  citalopram (CELEXA) 20 MG tablet Take 20 mg by mouth daily.   Yes Historical Provider, MD  clopidogrel (PLAVIX) 75 MG tablet Take 75 mg by mouth daily.   Yes Historical Provider, MD  diclofenac sodium (VOLTAREN) 1 % GEL Apply 2 g topically 4 (four) times daily.   Yes Historical Provider, MD  fluticasone (FLONASE) 50 MCG/ACT nasal spray Place into both  nostrils daily.   Yes Historical Provider, MD  HYDROcodone-acetaminophen (NORCO/VICODIN) 5-325 MG tablet Take 1 tablet by mouth every 6 (six) hours as needed for moderate pain or severe pain.   Yes Historical Provider, MD  meclizine (ANTIVERT) 25 MG tablet Take 25 mg by mouth 3 (three) times daily as needed for  dizziness.   Yes Historical Provider, MD  mirtazapine (REMERON) 15 MG tablet Take 15 mg by mouth at bedtime.   Yes Historical Provider, MD  Multiple Vitamins-Minerals (ICAPS AREDS FORMULA PO) Take 1 tablet by mouth 2 (two) times daily.   Yes Historical Provider, MD  oxybutynin (DITROPAN-XL) 10 MG 24 hr tablet Take 10 mg by mouth daily.   Yes Historical Provider, MD  pantoprazole (PROTONIX) 40 MG tablet Take 40 mg by mouth daily.   Yes Historical Provider, MD      VITAL SIGNS:  Blood pressure 179/72, pulse 70, temperature 97.8 F (36.6 C), temperature source Oral, resp. rate 16, height  (1.626 m), weight 47.628 kg (105 lb), SpO2 94 %.  PHYSICAL EXAMINATION:  Physical Exam  GENERAL:  81 y.o.-year-old patient lying lying in the bed with no acute distress.  EYES: Pupils equal, round, reactive to light and accommodation. No scleral icterus. Extraocular muscles intact.  HEENT: Head atraumatic, normocephalic. Oropharynx and nasopharynx clear. No oropharyngeal erythema, moist oral mucosa  NECK:  Supple, no jugular venous distention. No thyroid enlargement, no tenderness.  LUNGS: Normal breath sounds bilaterally, no wheezing, rales, rhonchi. No use of accessory muscles of respiration.  CARDIOVASCULAR: S1, S2 RRR. 2/6 systolic ejection murmur heard at the left sternal border, No rubs, gallops, clicks.  ABDOMEN: Soft, nontender, nondistended. Bowel sounds present. No organomegaly or mass.  EXTREMITIES: No pedal edema, cyanosis, or clubbing. + 2 pedal & radial pulses b/l.   NEUROLOGIC: Cranial nerves II through XII are intact. Right-sided hemiparesis from previous CVA. PSYCHIATRIC: The patient is alert and oriented x 2. Good affect.  SKIN: No obvious rash, lesion, or ulcer.   LABORATORY PANEL:   CBC  Recent Labs Lab 04/30/15 1104  WBC 8.9  HGB 12.9  HCT 39.1  PLT 205    ------------------------------------------------------------------------------------------------------------------  Chemistries   Recent Labs Lab 04/30/15 1104  NA 138  K 4.4  CL 102  CO2 30  GLUCOSE 80  BUN 14  CREATININE 0.54  CALCIUM 8.9  AST 21  ALT 14  ALKPHOS 76  BILITOT 0.6   ------------------------------------------------------------------------------------------------------------------  Cardiac Enzymes  Recent Labs Lab 04/30/15 1104  TROPONINI <0.03   ------------------------------------------------------------------------------------------------------------------  RADIOLOGY:  Dg Chest 2 View  04/30/2015  CLINICAL DATA:  Altered mental status.  Previous stroke. EXAM: CHEST  2 VIEW COMPARISON:  05/22/2014 FINDINGS: The heart size and mediastinal contours are within normal limits. Dual lead transvenous pacemaker remains in appropriate position. Previous aortic valve replacement again noted. Mild diffuse pulmonary interstitial prominence appears chronic. No evidence of acute infiltrate or edema. No evidence of pneumothorax or pleural effusion. Old upper lumbar vertebral body compression fracture deformity again noted. IMPRESSION: No active cardiopulmonary disease. Electronically Signed   By: Myles Rosenthal M.D.   On: 04/30/2015 12:27   Ct Head Wo Contrast  04/30/2015  CLINICAL DATA:  Altered mental status today. EXAM: CT HEAD WITHOUT CONTRAST TECHNIQUE: Contiguous axial images were obtained from the base of the skull through the vertex without intravenous contrast. COMPARISON:  08/30/2014 FINDINGS: Stable age related cerebral atrophy, ventriculomegaly and periventricular white matter disease. No extra-axial fluid collections are identified. No CT findings for acute hemispheric  infarction or intracranial hemorrhage. Remote left basal ganglia infarction. No mass lesions. The brainstem and cerebellum are normal. The bony structures are intact. There is a small amount of  fluid in both halves of the sphenoid sinus and scattered mucoperiosteal thickening involving the ethmoid air cells. The frontal sinus is clear. The mastoid air cells are clear. Stable vascular calcifications. The globes are intact. IMPRESSION: 1. Stable remote age related changes and remote left-sided basal ganglia infarction. 2. No CT findings for acute intracranial process. 3. Small amount of fluid in both halves of the sphenoid sinus and scattered ethmoid sinus disease. 4. No acute skull fracture. Electronically Signed   By: Rudie Meyer M.D.   On: 04/30/2015 12:28     IMPRESSION AND PLAN:   81 year old female with past medical history of previous CVA, GERD, depression, osteoarthritis, urinary incontinence who presented to the hospital due to altered mental status.  #1 altered mental status-this is likely metabolic encephalopathy secondary to UTI combined with mild underlying dementia -Unlikely acute CVA to underlying neurologic process. CT head on admission is negative. -I will treat with IV antibiotics for the UTI and follow mental status.  #2 urinary tract infection-I will treat her with IV ceftriaxone and follow urine cultures.  #3 urinary incontinence-continue oxybutynin. Next  #4 GERD-continue Protonix.  #5 history of previous CVA-continue Plavix.  #6 depression-continue Celexa, Remeron.  #7 osteoarthritis-continue diclofenac.   All the records are reviewed and case discussed with ED provider. Management plans discussed with the patient, family and they are in agreement.  CODE STATUS: DO NOT RESUSCITATE  TOTAL TIME TAKING CARE OF THIS PATIENT: 45 minutes.    Houston Siren M.D on 04/30/2015 at 2:16 PM  Between 7am to 6pm - Pager - [phone]  After 6pm go to [email or web address] - password [email or web address] [facility]  [facility]  Office  [phone]  CC: Primary care physician; BABAOFF, Lavada Mesi, MD

## 2015-04-30 NOTE — Care Management Obs Status (Signed)
MEDICARE OBSERVATION STATUS NOTIFICATION   Patient Details  Name: Denise Calderon MRN: [id number] Date of Birth: Jun 13, 1923   Medicare Observation Status Notification Given:  Yes Given to daughter at bedside in ER    Berna BueCheryl Lyndsy Gilberto, RN 04/30/2015, 2:36 PM

## 2015-04-30 NOTE — ED Provider Notes (Signed)
[facility] Emergency Department Provider Note  ____________________________________________  Time seen: Approximately 11:32 AM  I have reviewed the triage vital signs and the nursing notes.   HISTORY  Chief Complaint Altered Mental Status    HPI Denise Calderon is a 81 y.o. female nursing home resident with a history of CVA, HTN, pacemaker placement, who presents for altered mental status.The patient provides some history as well as the daughter. Over the last several months, 2-3 times per month, the patient develops episodes of acute vertigo, I'll abide by falling asleep in her chair. Her daughter reports that they treat this by laying the patient flat and usually within one hour she is back to her normal mental status. Initially she was treated with meclizine for vertigo but the patient's daughter reports this seemed to result in worsening altered mental status. This morning, it is described that the patient was watching Dr. Michele Mcalpine when she had acute onset of feeling hot and dizzy like the room was spinning. The patient also reports that earlier today she tried to go urinate and was unable to PE. No recent fevers, chills, chest pain, shortness of breath, cough or cold symptoms.  Per the patient and her daughter, the patient's neuro baseline is that she is alert and oriented to person, has a a left facial droop, and the right upper and lower extremity hemiparesis.   Past Medical History  Diagnosis Date  . Stroke (HCC)   . Pacemaker   . Hypertension   . Anemia     There are no active problems to display for this patient.   History reviewed. No pertinent past surgical history.  Current Outpatient Rx  Name  Route  Sig  Dispense  Refill  . acetaminophen (MAPAP) 500 MG tablet   Oral   Take 1,000 mg by mouth 3 (three) times daily.         . Calcium Carbonate-Vitamin D (CALCIUM-D) 600-400 MG-UNIT TABS   Oral   Take 1 tablet by mouth 2 (two) times daily.         . carboxymethylcellulose (REFRESH TEARS) 0.5 % SOLN   Both Eyes   Place 1-2 drops into both eyes as needed (FOR DRY EYES).         . citalopram (CELEXA) 20 MG tablet   Oral   Take 20 mg by mouth daily.         . clopidogrel (PLAVIX) 75 MG tablet   Oral   Take 75 mg by mouth daily.         . diclofenac sodium (VOLTAREN) 1 % GEL   Topical   Apply 2 g topically 4 (four) times daily.         . fluticasone (FLONASE) 50 MCG/ACT nasal spray   Each Nare   Place into both nostrils daily.         Marland Kitchen HYDROcodone-acetaminophen (NORCO/VICODIN) 5-325 MG tablet   Oral   Take 1 tablet by mouth every 6 (six) hours as needed for moderate pain or severe pain.         . meclizine (ANTIVERT) 25 MG tablet   Oral   Take 25 mg by mouth 3 (three) times daily as needed for dizziness.         . mirtazapine (REMERON) 15 MG tablet   Oral   Take 15 mg by mouth at bedtime.         . Multiple Vitamins-Minerals (ICAPS AREDS FORMULA PO)   Oral   Take  1 tablet by mouth 2 (two) times daily.         Marland Kitchen. oxybutynin (DITROPAN-XL) 10 MG 24 hr tablet   Oral   Take 10 mg by mouth daily.         . pantoprazole (PROTONIX) 40 MG tablet   Oral   Take 40 mg by mouth daily.           Allergies Review of patient's allergies indicates no known allergies.  History reviewed. No pertinent family history.  Social History Social History  Substance Use Topics  . Smoking status: Unknown If Ever Smoked  . Smokeless tobacco: None  . Alcohol Use: No    Review of Systems Constitutional: No fever/chills. No syncopal or lightheadedness. Positive diaphoresis and confusion. Eyes: No visual changes. No blurred or double vision. ENT: No sore throat. No congestion or rhinorrhea. Cardiovascular: Denies chest pain, palpitations. Respiratory: Denies shortness of breath.  No cough. Gastrointestinal: No abdominal pain.  No nausea, no vomiting.  No diarrhea.  No constipation. Genitourinary:  Negative for dysuria. Positive urinary retention Musculoskeletal: Negative for back pain. Skin: Negative for rash. Neurological: Negative for headaches, positive for chronic right hemiparesis and left facial droop. No new numbness or tingling. Positive acute on chronic dizziness.  10-point ROS otherwise negative.  ____________________________________________   PHYSICAL EXAM:  VITAL SIGNS: ED Triage Vitals  Enc Vitals Group     BP 04/30/15 1052 97/87 mmHg     Pulse Rate 04/30/15 1052 71     Resp 04/30/15 1052 17     Temp 04/30/15 1052 97.8 F (36.6 C)     Temp Source 04/30/15 1052 Oral     SpO2 04/30/15 1045 98 %     Weight 04/30/15 1052 105 lb (47.628 kg)     Height 04/30/15 1052 5\' 4"  (1.626 m)     Head Cir --      Peak Flow --      Pain Score --      Pain Loc --      Pain Edu? --      Excl. in GC? --     Constitutional: Patient is alert and oriented to person only; she does know the month but not the year or the name of this hospital. She is able to answer basic questions. Makes good eye contact.  Eyes: Conjunctivae are normal.  EOMI. no scleral icterus. PERRLA. Head: Atraumatic. No raccoon eyes or Battle sign. Nose: No congestion/rhinnorhea. Mouth/Throat: Mucous membranes are dry.  Neck: No stridor.  Supple.  Positive JVD. Cardiovascular: Normal rate, regular rhythm. No murmurs, rubs or gallops.  Respiratory: Normal respiratory effort.  No retractions. Lungs CTAB.  No wheezes, rales or ronchi. Gastrointestinal: Soft and nontender. No distention. No peritoneal signs. Musculoskeletal: No LE edema.  Neurologic:  Patient is alert to person and month only. Speech is clear. Left facial droop. EOMI and PERRLA. No nystagmus. Hemi-paresis of the right upper and right lower extremity. Normal movement of the left upper and left lower extremity.  Skin:  Skin is warm, dry and intact. No rash noted. Psychiatric: Mood and affect are normal. Speech and behavior are normal.  Normal  judgement  ____________________________________________   LABS (all labs ordered are listed, but only abnormal results are displayed)  Labs Reviewed  URINALYSIS COMPLETEWITH MICROSCOPIC ([facility] ONLY) - Abnormal; Notable for the following:    Color, Urine YELLOW (*)    APPearance CLOUDY (*)    Leukocytes, UA 3+ (*)    Bacteria, UA FEW (*)  Squamous Epithelial / LPF 0-5 (*)    All other components within normal limits  URINE CULTURE  CBC  COMPREHENSIVE METABOLIC PANEL  TROPONIN I   ____________________________________________  EKG  ED ECG REPORT I, Rockne Menghini, the attending physician, personally viewed and interpreted this ECG.   Date: 04/30/2015  EKG Time: 1135  Rate: 72 Patient is paced at a rate of 72 which is unchanged compared to her previous and 01/16    ____________________________________________  RADIOLOGY  Dg Chest 2 View  04/30/2015  CLINICAL DATA:  Altered mental status.  Previous stroke. EXAM: CHEST  2 VIEW COMPARISON:  05/22/2014 FINDINGS: The heart size and mediastinal contours are within normal limits. Dual lead transvenous pacemaker remains in appropriate position. Previous aortic valve replacement again noted. Mild diffuse pulmonary interstitial prominence appears chronic. No evidence of acute infiltrate or edema. No evidence of pneumothorax or pleural effusion. Old upper lumbar vertebral body compression fracture deformity again noted. IMPRESSION: No active cardiopulmonary disease. Electronically Signed   By: Myles Rosenthal M.D.   On: 04/30/2015 12:27   Ct Head Wo Contrast  04/30/2015  CLINICAL DATA:  Altered mental status today. EXAM: CT HEAD WITHOUT CONTRAST TECHNIQUE: Contiguous axial images were obtained from the base of the skull through the vertex without intravenous contrast. COMPARISON:  08/30/2014 FINDINGS: Stable age related cerebral atrophy, ventriculomegaly and periventricular white matter disease. No extra-axial fluid collections are  identified. No CT findings for acute hemispheric infarction or intracranial hemorrhage. Remote left basal ganglia infarction. No mass lesions. The brainstem and cerebellum are normal. The bony structures are intact. There is a small amount of fluid in both halves of the sphenoid sinus and scattered mucoperiosteal thickening involving the ethmoid air cells. The frontal sinus is clear. The mastoid air cells are clear. Stable vascular calcifications. The globes are intact. IMPRESSION: 1. Stable remote age related changes and remote left-sided basal ganglia infarction. 2. No CT findings for acute intracranial process. 3. Small amount of fluid in both halves of the sphenoid sinus and scattered ethmoid sinus disease. 4. No acute skull fracture. Electronically Signed   By: Rudie Meyer M.D.   On: 04/30/2015 12:28    ____________________________________________   PROCEDURES  Procedure(s) performed: None  Critical Care performed: No ____________________________________________   INITIAL IMPRESSION / ASSESSMENT AND PLAN / ED COURSE  Pertinent labs & imaging results that were available during my care of the patient were reviewed by me and considered in my medical decision making (see chart for details).  81 y.o. female with a history of stroke and multiple residual neurologic deficits, HTN, with recent vertiginous episodes presenting with vertigo and altered mental status. We will evaluate for any signs of urinary tract infection, Latoya melons, stroke. Also rule out ACS or acute MI. Will reevaluate the patient after initial diagnostic workup is complete.  ----------------------------------------- 1:39 PM on 04/30/2015 -----------------------------------------  The patient's mental status is grossly unchanged and she continues to be hemodynamically stable. She does have a urinary tract infection which may have been contributing to her symptoms of dizziness, sweatiness, and no altered mental status. I  will plan to admit her to the hospital. She has received 1 g of Rocephin. ____________________________________________  FINAL CLINICAL IMPRESSION(S) / ED DIAGNOSES  Final diagnoses:  UTI (lower urinary tract infection)  Altered mental status, unspecified altered mental status type      NEW MEDICATIONS STARTED DURING THIS VISIT:  New Prescriptions   No medications on file     Rockne Menghini, MD 04/30/15  1340 

## 2015-05-01 DIAGNOSIS — R4182 Altered mental status, unspecified: Secondary | ICD-10-CM | POA: Diagnosis not present

## 2015-05-01 LAB — CBC
HEMATOCRIT: 38 % (ref 35.0–47.0)
Hemoglobin: 12.4 g/dL (ref 12.0–16.0)
MCH: 32.8 pg (ref 26.0–34.0)
MCHC: 32.7 g/dL (ref 32.0–36.0)
MCV: 100.2 fL — AB (ref 80.0–100.0)
Platelets: 200 10*3/uL (ref 150–440)
RBC: 3.79 MIL/uL — ABNORMAL LOW (ref 3.80–5.20)
RDW: 12.6 % (ref 11.5–14.5)
WBC: 7.6 10*3/uL (ref 3.6–11.0)

## 2015-05-01 LAB — BASIC METABOLIC PANEL
Anion gap: 3 — ABNORMAL LOW (ref 5–15)
BUN: 9 mg/dL (ref 6–20)
CHLORIDE: 101 mmol/L (ref 101–111)
CO2: 31 mmol/L (ref 22–32)
Calcium: 8.5 mg/dL — ABNORMAL LOW (ref 8.9–10.3)
Creatinine, Ser: 0.59 mg/dL (ref 0.44–1.00)
GFR calc Af Amer: >60 mL/min (ref >60)
GFR calc non Af Amer: >60 mL/min (ref >60)
GLUCOSE: 90 mg/dL (ref 65–99)
POTASSIUM: 4.2 mmol/L (ref 3.5–5.1)
Sodium: 135 mmol/L (ref 135–145)

## 2015-05-01 MED ORDER — CEFUROXIME AXETIL 250 MG PO TABS: 250.0000 mg | ORAL_TABLET | Freq: Two times a day (BID) | ORAL | Status: AC

## 2015-05-01 NOTE — NC FL2 (Signed)
[place] MEDICAID FL2 LEVEL OF CARE SCREENING TOOL     IDENTIFICATION  Patient Name: Denise Calderon Birthdate: 1923/06/22 Sex: female Admission Date (Current Location): 04/30/2015  [place]    Facility and Address:  [facility], [address], [place], [place] [address]      Provider Number: [id number]  Attending Physician Name and Address:  Altamese DillingVaibhavkumar Vonceil [place], MD  Relative Name and Phone Number:       Current Level of Care: Hospital Recommended Level of Care: Assisted Living Facility Prior Approval Number:    Date Approved/Denied:   PASRR Number:    Discharge Plan: Other (Comment) (Assisted Living Facility )    Current Diagnoses: Patient Active Problem List   Diagnosis Date Noted  . Altered mental status 04/30/2015    Orientation RESPIRATION BLADDER Height & Weight    Self, Time, Situation  Normal Continent 5\' 4"  (162.6 cm) 105 lbs.  BEHAVIORAL SYMPTOMS/MOOD NEUROLOGICAL BOWEL NUTRITION STATUS      Continent Diet  AMBULATORY STATUS COMMUNICATION OF NEEDS Skin   Extensive Assist (Does not ambulate) Verbally Normal                       Personal Care Assistance Level of Assistance  Bathing, Feeding, Dressing Bathing Assistance: Limited assistance Feeding assistance: Limited assistance Dressing Assistance: Limited assistance     Functional Limitations Info             SPECIAL CARE FACTORS FREQUENCY                       Contractures      Additional Factors Info  Code Status, Allergies Code Status Info: Full Code Allergies Info: No known allergies           Current Medications (05/01/2015):  This is the current hospital active medication list Current Facility-Administered Medications  Medication Dose Route Frequency Provider Last Rate Last Dose  . acetaminophen (TYLENOL) tablet 650 mg  650 mg Oral Q6H PRN Houston SirenVivek J Sainani, MD       Or  . acetaminophen (TYLENOL)  suppository 650 mg  650 mg Rectal Q6H PRN Houston SirenVivek J Sainani, MD      . acetaminophen (TYLENOL) tablet 1,000 mg  1,000 mg Oral TID Houston SirenVivek J Sainani, MD   1,000 mg at 05/01/15 1131  . calcium-vitamin D (OSCAL WITH D) 500-200 MG-UNIT per tablet 1 tablet  1 tablet Oral BID Houston SirenVivek J Sainani, MD   1 tablet at 05/01/15 1132  . cefTRIAXone (ROCEPHIN) 1 g in dextrose 5 % 50 mL IVPB  1 g Intravenous Q24H Houston SirenVivek J Sainani, MD      . citalopram (CELEXA) tablet 20 mg  20 mg Oral Daily Houston SirenVivek J Sainani, MD   20 mg at 05/01/15 1132  . clopidogrel (PLAVIX) tablet 75 mg  75 mg Oral Daily Houston SirenVivek J Sainani, MD   75 mg at 05/01/15 1131  . diclofenac sodium (VOLTAREN) 1 % transdermal gel 2 g  2 g Topical QID Houston SirenVivek J Sainani, MD   2 g at 05/01/15 1132  . enoxaparin (LOVENOX) injection 40 mg  40 mg Subcutaneous Q24H Houston SirenVivek J Sainani, MD   40 mg at 04/30/15 1549  . fluticasone (FLONASE) 50 MCG/ACT nasal spray 1 spray  1 spray Each Nare Daily Houston SirenVivek J Sainani, MD   1 spray at 05/01/15 1132  . HYDROcodone-acetaminophen (NORCO/VICODIN) 5-325 MG per tablet 1 tablet  1 tablet  Oral Q6H PRN Houston Siren, MD      . loratadine (CLARITIN) tablet 10 mg  10 mg Oral Daily Houston Siren, MD   10 mg at 05/01/15 1131  . meclizine (ANTIVERT) tablet 25 mg  25 mg Oral TID PRN Houston Siren, MD      . mirtazapine (REMERON) tablet 15 mg  15 mg Oral QHS Houston Siren, MD   15 mg at 04/30/15 2158  . ondansetron (ZOFRAN) tablet 4 mg  4 mg Oral Q6H PRN Houston Siren, MD       Or  . ondansetron (ZOFRAN) injection 4 mg  4 mg Intravenous Q6H PRN Houston Siren, MD      . oxybutynin (DITROPAN-XL) 24 hr tablet 10 mg  10 mg Oral Daily Houston Siren, MD   10 mg at 05/01/15 1131  . pantoprazole (PROTONIX) EC tablet 40 mg  40 mg Oral Daily Houston Siren, MD   40 mg at 05/01/15 1132  . polyvinyl alcohol (LIQUIFILM TEARS) 1.4 % ophthalmic solution 1-2 drop  1-2 drop Both Eyes PRN Houston Siren, MD         Discharge Medications: Please see  discharge summary for a list of discharge medications. Current Discharge Medication List    START taking these medications   Details  cefUROXime (CEFTIN) 250 MG tablet Take 1 tablet (250 mg total) by mouth 2 (two) times daily with a meal. Qty: 10 tablet, Refills: 0      CONTINUE these medications which have NOT CHANGED   Details  acetaminophen (MAPAP) 500 MG tablet Take 1,000 mg by mouth 3 (three) times daily.    Calcium Carbonate-Vitamin D (CALCIUM-D) 600-400 MG-UNIT TABS Take 1 tablet by mouth 2 (two) times daily.    carboxymethylcellulose (REFRESH TEARS) 0.5 % SOLN Place 1-2 drops into both eyes as needed (FOR DRY EYES).    citalopram (CELEXA) 20 MG tablet Take 20 mg by mouth daily.    clopidogrel (PLAVIX) 75 MG tablet Take 75 mg by mouth daily.    diclofenac sodium (VOLTAREN) 1 % GEL Apply 2 g topically 4 (four) times daily.    fluticasone (FLONASE) 50 MCG/ACT nasal spray Place into both nostrils daily.    HYDROcodone-acetaminophen (NORCO/VICODIN) 5-325 MG tablet Take 1 tablet by mouth every 6 (six) hours as needed for moderate pain or severe pain.    meclizine (ANTIVERT) 25 MG tablet Take 25 mg by mouth 3 (three) times daily as needed for dizziness.    mirtazapine (REMERON) 15 MG tablet Take 15 mg by mouth at bedtime.    Multiple Vitamins-Minerals (ICAPS AREDS FORMULA PO) Take 1 tablet by mouth 2 (two) times daily.    oxybutynin (DITROPAN-XL) 10 MG 24 hr tablet Take 10 mg by mouth daily.    pantoprazole (PROTONIX) 40 MG tablet Take 40 mg by mouth daily.           Relevant Imaging Results:  Relevant Lab Results:   Additional Information    Dede Query, LCSW

## 2015-05-01 NOTE — Clinical Social Work Note (Signed)
Clinical Social Work Assessment  Patient Details  Name: Denise Calderon MRN: [id number] Date of Birth: 1923-06-10  Date of referral:  05/01/15               Reason for consult:  Facility Placement                Permission sought to share information with:  Family Supports Permission granted to share information::  Yes, Verbal Permission Granted  Name::     Earlie Server, daughter and son.    Housing/Transportation Living arrangements for the past 2 months:  [place] of Information:  Adult Children, Patient Patient Interpreter Needed:  None Criminal Activity/Legal Involvement Pertinent to Current Situation/Hospitalization:  No - Comment as needed Significant Relationships:  Adult Children Lives with:  Facility Resident Do you feel safe going back to the place where you live?  Yes Need for family participation in patient care:  Yes (Comment)  Care giving concerns:  No care giving concerns identified.   Social Worker assessment / plan:  CSW met with pt and son to address consult as pt was admitted from [facility]. CSW introduced herself and explained role of social work. CSW also explained the process of discharging and returning to ALF. Pt had a CVA 5 years ago and no longer ambulates. Pt is looking forward to returning "home". Per pt's son, pt will need EMS. CSW updated facility and they have received discharge information. Facility is ready to admit pt. Pt and family are agreeable to discharge plan. CSW updated Designer, multimedia. CSW will sign off as no further needs identified.   Employment status:  Retired Forensic scientist:  Information systems manager, Medicaid In [place] PT Recommendations:  Not assessed at this time [place] / Referral to community resources:  Other (Comment Required) (ALF)  Patient/Family's Response to care:  Pt and family were appreciative of CSW support.   Patient/Family's Understanding of and Emotional Response to Diagnosis, Current Treatment,  and Prognosis:  Pt understands that she needs a supportive living arrangement and would like to return to ALF.   Emotional Assessment Appearance:  Appears stated age Attitude/Demeanor/Rapport:   (Appropriate) Affect (typically observed):  Accepting, Pleasant Orientation:  Oriented to Self, Oriented to Place, Oriented to  Time, Oriented to Situation Alcohol / Substance use:  Never Used Psych involvement (Current and /or in the community):  No (Comment)  Discharge Needs  Concerns to be addressed:  No discharge needs identified Readmission within the last 30 days:  Yes Current discharge risk:  None Barriers to Discharge:  No Barriers Identified   Darden Dates, LCSW 05/01/2015, 1:49 PM [phone]

## 2015-05-01 NOTE — Plan of Care (Signed)
Problem: Education: Goal: Knowledge of [facility] Education information/materials will improve Outcome: Progressing Pt is alert to self, oriented and re-educated.     Problem: Safety: Goal: Ability to remain free from injury will improve Outcome: Progressing Pt is a high fall risk, hourly rounding. Remains incontinent of urine.  Problem: Pain Managment: Goal: General experience of comfort will improve Outcome: Progressing No c/o pain or discomfort at this time.

## 2015-05-01 NOTE — Discharge Summary (Signed)
[facility] at [facility]   PATIENT NAME: Denise Calderon    MR#:  [id number]  DATE OF BIRTH:  [birth year]  DATE OF ADMISSION:  04/30/2015 ADMITTING PHYSICIAN: Houston Siren, MD  DATE OF DISCHARGE: 05/01/2015  PRIMARY CARE PHYSICIAN: BABAOFF, MARC E, MD    ADMISSION DIAGNOSIS:  Diaphoresis [R61] Dizziness [R42] UTI (lower urinary tract infection) [N39.0] Altered mental status, unspecified altered mental status type [R41.82]  DISCHARGE DIAGNOSIS:  Active Problems:   Altered mental status   SECONDARY DIAGNOSIS:   Past Medical History  Diagnosis Date  . Stroke (HCC)   . Pacemaker   . Hypertension   . Anemia     HOSPITAL COURSE:   81 year old female with past medical history of previous CVA, GERD, depression, osteoarthritis, urinary incontinence who presented to the hospital due to altered mental status.  #1 altered mental status-this is likely metabolic encephalopathy secondary to UTI combined with mild underlying dementia -Unlikely acute CVA to underlying neurologic process. CT head on admission is negative. - treated with IV antibiotics for the UTI and follow mental status- completely alert and oriented, same as baseline the next day.  #2 urinary tract infection- treat her with IV ceftriaxone and follow urine cultures.   As improved- give oral ceftin for 5 days.  #3 urinary incontinence-continue oxybutynin.   #4 GERD-continue Protonix.  #5 history of previous CVA-continue Plavix.  #6 depression-continue Celexa, Remeron.  #7 osteoarthritis-continue diclofenac.  DISCHARGE CONDITIONS:   Stable.  CONSULTS OBTAINED:     DRUG ALLERGIES:  No Known Allergies  DISCHARGE MEDICATIONS:   Current Discharge Medication List    START taking these medications   Details  cefUROXime (CEFTIN) 250 MG tablet Take 1 tablet (250 mg total) by mouth 2 (two) times daily with a meal. Qty: 10 tablet, Refills: 0      CONTINUE these  medications which have NOT CHANGED   Details  acetaminophen (MAPAP) 500 MG tablet Take 1,000 mg by mouth 3 (three) times daily.    Calcium Carbonate-Vitamin D (CALCIUM-D) 600-400 MG-UNIT TABS Take 1 tablet by mouth 2 (two) times daily.    carboxymethylcellulose (REFRESH TEARS) 0.5 % SOLN Place 1-2 drops into both eyes as needed (FOR DRY EYES).    citalopram (CELEXA) 20 MG tablet Take 20 mg by mouth daily.    clopidogrel (PLAVIX) 75 MG tablet Take 75 mg by mouth daily.    diclofenac sodium (VOLTAREN) 1 % GEL Apply 2 g topically 4 (four) times daily.    fluticasone (FLONASE) 50 MCG/ACT nasal spray Place into both nostrils daily.    HYDROcodone-acetaminophen (NORCO/VICODIN) 5-325 MG tablet Take 1 tablet by mouth every 6 (six) hours as needed for moderate pain or severe pain.    meclizine (ANTIVERT) 25 MG tablet Take 25 mg by mouth 3 (three) times daily as needed for dizziness.    mirtazapine (REMERON) 15 MG tablet Take 15 mg by mouth at bedtime.    Multiple Vitamins-Minerals (ICAPS AREDS FORMULA PO) Take 1 tablet by mouth 2 (two) times daily.    oxybutynin (DITROPAN-XL) 10 MG 24 hr tablet Take 10 mg by mouth daily.    pantoprazole (PROTONIX) 40 MG tablet Take 40 mg by mouth daily.         DISCHARGE INSTRUCTIONS:    Follow with PMD  If you experience worsening of your admission symptoms, develop shortness of breath, life threatening emergency, suicidal or homicidal thoughts you must seek medical attention immediately by calling 911 or calling your  MD immediately  if symptoms less severe.  You Must read complete instructions/literature along with all the possible adverse reactions/side effects for all the Medicines you take and that have been prescribed to you. Take any new Medicines after you have completely understood and accept all the possible adverse reactions/side effects.   Please note  You were cared for by a hospitalist during your hospital stay. If you have any  questions about your discharge medications or the care you received while you were in the hospital after you are discharged, you can call the unit and asked to speak with the hospitalist on call if the hospitalist that took care of you is not available. Once you are discharged, your primary care physician will handle any further medical issues. Please note that NO REFILLS for any discharge medications will be authorized once you are discharged, as it is imperative that you return to your primary care physician (or establish a relationship with a primary care physician if you do not have one) for your aftercare needs so that they can reassess your need for medications and monitor your lab values.    Today   CHIEF COMPLAINT:   Chief Complaint  Patient presents with  . Altered Mental Status    HISTORY OF PRESENT ILLNESS:  Denise Calderon  is a 81 y.o. female with a known history of previous CVA, hypertension, urinary incontinence, depression, GERD, s/p Pacemaker who presented to the hospital due to altered mental status. Patient herself has some mild dementia and therefore history obtained from the daughter at bedside. As per the daughter patient was a bit more confused today than usual. She currently resides at the assisted living and they called her and notified that her mother has not been acting right and she was complaining of some blurry vision. She was brought to the ER for further evaluation. Patient underwent a CT of the head which is essentially benign but was incidentally also noted to have a urinary tract infection. Hospitalist services were contacted further treatment and evaluation. Patient denies any dysuria, hematuria, fever, chills, nausea, vomiting, dizziness or any other associated symptoms presently.   VITAL SIGNS:  Blood pressure 166/66, pulse 76, temperature 98 F (36.7 C), temperature source Oral, resp. rate 20, height 5\' 4"  (1.626 m), weight 47.628 kg (105 lb), SpO2 97  %.  I/O:   Intake/Output Summary (Last 24 hours) at 05/01/15 1148 Last data filed at 05/01/15 0915  Gross per 24 hour  Intake    600 ml  Output   1450 ml  Net   -850 ml    PHYSICAL EXAMINATION:   GENERAL: 81 y.o.-year-old patient lying in the bed with no acute distress.  EYES: Pupils equal, round, reactive to light and accommodation. No scleral icterus. Extraocular muscles intact.  HEENT: Head atraumatic, normocephalic. Oropharynx and nasopharynx clear. No oropharyngeal erythema, moist oral mucosa  NECK: Supple, no jugular venous distention. No thyroid enlargement, no tenderness.  LUNGS: Normal breath sounds bilaterally, no wheezing, rales, rhonchi. No use of accessory muscles of respiration.  CARDIOVASCULAR: S1, S2 RRR. 2/6 systolic ejection murmur heard at the left sternal border, No rubs, gallops, clicks.  ABDOMEN: Soft, nontender, nondistended. Bowel sounds present. No organomegaly or mass.  EXTREMITIES: No pedal edema, cyanosis, or clubbing. + 2 pedal & radial pulses b/l.  NEUROLOGIC: Cranial nerves II through XII are intact. Right-sided hemiparesis from previous CVA. PSYCHIATRIC: The patient is alert and oriented x 2. Good affect.  SKIN: No obvious rash, lesion, or ulcer.  DATA REVIEW:   CBC  Recent Labs Lab 05/01/15 0423  WBC 7.6  HGB 12.4  HCT 38.0  PLT 200    Chemistries   Recent Labs Lab 04/30/15 1104 05/01/15 0423  NA 138 135  K 4.4 4.2  CL 102 101  CO2 30 31  GLUCOSE 80 90  BUN 14 9  CREATININE 0.54 0.59  CALCIUM 8.9 8.5*  AST 21  --   ALT 14  --   ALKPHOS 76  --   BILITOT 0.6  --     Cardiac Enzymes  Recent Labs Lab 04/30/15 1104  TROPONINI <0.03    Microbiology Results  Results for orders placed or performed during the hospital encounter of 04/30/15  Urine culture     Status: None (Preliminary result)   Collection Time: 04/30/15 11:04 AM  Result Value Ref Range Status   Specimen Description URINE, RANDOM  Final    Special Requests NONE  Final   Culture TOO YOUNG TO READ  Final   Report Status PENDING  Incomplete  MRSA PCR Screening     Status: None   Collection Time: 04/30/15  3:55 PM  Result Value Ref Range Status   MRSA by PCR NEGATIVE NEGATIVE Final    Comment:        The GeneXpert MRSA Assay (FDA approved for NASAL specimens only), is one component of a comprehensive MRSA colonization surveillance program. It is not intended to diagnose MRSA infection nor to guide or monitor treatment for MRSA infections.     RADIOLOGY:  Dg Chest 2 View  04/30/2015  CLINICAL DATA:  Altered mental status.  Previous stroke. EXAM: CHEST  2 VIEW COMPARISON:  05/22/2014 FINDINGS: The heart size and mediastinal contours are within normal limits. Dual lead transvenous pacemaker remains in appropriate position. Previous aortic valve replacement again noted. Mild diffuse pulmonary interstitial prominence appears chronic. No evidence of acute infiltrate or edema. No evidence of pneumothorax or pleural effusion. Old upper lumbar vertebral body compression fracture deformity again noted. IMPRESSION: No active cardiopulmonary disease. Electronically Signed   By: Myles Rosenthal M.D.   On: 04/30/2015 12:27   Ct Head Wo Contrast  04/30/2015  CLINICAL DATA:  Altered mental status today. EXAM: CT HEAD WITHOUT CONTRAST TECHNIQUE: Contiguous axial images were obtained from the base of the skull through the vertex without intravenous contrast. COMPARISON:  08/30/2014 FINDINGS: Stable age related cerebral atrophy, ventriculomegaly and periventricular white matter disease. No extra-axial fluid collections are identified. No CT findings for acute hemispheric infarction or intracranial hemorrhage. Remote left basal ganglia infarction. No mass lesions. The brainstem and cerebellum are normal. The bony structures are intact. There is a small amount of fluid in both halves of the sphenoid sinus and scattered mucoperiosteal thickening  involving the ethmoid air cells. The frontal sinus is clear. The mastoid air cells are clear. Stable vascular calcifications. The globes are intact. IMPRESSION: 1. Stable remote age related changes and remote left-sided basal ganglia infarction. 2. No CT findings for acute intracranial process. 3. Small amount of fluid in both halves of the sphenoid sinus and scattered ethmoid sinus disease. 4. No acute skull fracture. Electronically Signed   By: Rudie Meyer M.D.   On: 04/30/2015 12:28      Management plans discussed with the patient, family and they are in agreement.  CODE STATUS:     Code Status Orders        Start     Ordered   04/30/15 1518  Do not  attempt resuscitation (DNR)   Continuous    Question Answer Comment  In the event of cardiac or respiratory ARREST Do not call a "code blue"   In the event of cardiac or respiratory ARREST Do not perform Intubation, CPR, defibrillation or ACLS   In the event of cardiac or respiratory ARREST Use medication by any route, position, wound care, and other measures to relive pain and suffering. May use oxygen, suction and manual treatment of airway obstruction as needed for comfort.      04/30/15 1517    Advance Directive Documentation        Most Recent Value   Type of Advance Directive  Living will   Pre-existing out of facility DNR order (yellow form or pink MOST form)     "MOST" Form in Place?        TOTAL TIME TAKING CARE OF THIS PATIENT: 35 minutes.    Altamese Dilling M.D on 05/01/2015 at 11:48 AM  Between 7am to 6pm - Pager - [phone]  After 6pm go to [email or web address] - password [email or web address] [facility]  [place]  [facility]  Office  [phone]  CC: Primary care physician; BABAOFF, Lavada Mesi, MD   Note: This dictation was prepared with Dragon dictation along with smaller phrase technology. Any transcriptional errors that result from this process are unintentional.

## 2015-05-01 NOTE — Progress Notes (Addendum)
Patient discharged to ALF per MD order. Discharge instructions given to patient and son.  Activity, diet, follow up care, medicines, and prescription reviewed with patient. IV removed.  Patient to transport via EMS.

## 2015-05-02 LAB — URINE CULTURE

## 2015-08-04 ENCOUNTER — Emergency Department
Admission: EM | Admit: 2015-08-04 | Discharge: 2015-08-04 | Disposition: A | Discharge status: Home / Self Care | Payer: Medicare Other | Attending: Emergency Medicine | Admitting: Emergency Medicine

## 2015-08-04 ENCOUNTER — Encounter: Payer: Self-pay | Admitting: *Deleted

## 2015-08-04 DIAGNOSIS — R531 Weakness: Secondary | ICD-10-CM

## 2015-08-04 DIAGNOSIS — I10 Essential (primary) hypertension: Secondary | ICD-10-CM | POA: Insufficient documentation

## 2015-08-04 DIAGNOSIS — Z950 Presence of cardiac pacemaker: Secondary | ICD-10-CM | POA: Insufficient documentation

## 2015-08-04 DIAGNOSIS — Z7951 Long term (current) use of inhaled steroids: Secondary | ICD-10-CM | POA: Diagnosis not present

## 2015-08-04 DIAGNOSIS — Z79899 Other long term (current) drug therapy: Secondary | ICD-10-CM | POA: Insufficient documentation

## 2015-08-04 DIAGNOSIS — I639 Cerebral infarction, unspecified: Secondary | ICD-10-CM | POA: Insufficient documentation

## 2015-08-04 DIAGNOSIS — N390 Urinary tract infection, site not specified: Secondary | ICD-10-CM | POA: Diagnosis not present

## 2015-08-04 DIAGNOSIS — M6281 Muscle weakness (generalized): Secondary | ICD-10-CM | POA: Diagnosis present

## 2015-08-04 LAB — URINALYSIS COMPLETE WITH MICROSCOPIC (ARMC ONLY)
Bacteria, UA: NONE SEEN
Bilirubin Urine: NEGATIVE
Glucose, UA: NEGATIVE mg/dL
HGB URINE DIPSTICK: NEGATIVE
KETONES UR: NEGATIVE mg/dL
NITRITE: NEGATIVE
PROTEIN: NEGATIVE mg/dL
SPECIFIC GRAVITY, URINE: 1.009 (ref 1.005–1.030)
pH: 6 (ref 5.0–8.0)

## 2015-08-04 LAB — CBC
HCT: 37.8 % (ref 35.0–47.0)
Hemoglobin: 12.8 g/dL (ref 12.0–16.0)
MCH: 32.8 pg (ref 26.0–34.0)
MCHC: 34 g/dL (ref 32.0–36.0)
MCV: 96.7 fL (ref 80.0–100.0)
PLATELETS: 203 10*3/uL (ref 150–440)
RBC: 3.91 MIL/uL (ref 3.80–5.20)
RDW: 12.9 % (ref 11.5–14.5)
WBC: 5.9 10*3/uL (ref 3.6–11.0)

## 2015-08-04 LAB — BASIC METABOLIC PANEL
ANION GAP: 8 (ref 5–15)
BUN: 16 mg/dL (ref 6–20)
CALCIUM: 8.9 mg/dL (ref 8.9–10.3)
CO2: 26 mmol/L (ref 22–32)
Chloride: 100 mmol/L — ABNORMAL LOW (ref 101–111)
Creatinine, Ser: 0.54 mg/dL (ref 0.44–1.00)
Glucose, Bld: 90 mg/dL (ref 65–99)
Potassium: 3.9 mmol/L (ref 3.5–5.1)
SODIUM: 134 mmol/L — AB (ref 135–145)

## 2015-08-04 MED ORDER — FOSFOMYCIN TROMETHAMINE 3 G PO PACK: 3.0000 g | PACK | Freq: Once | ORAL | Status: DC

## 2015-08-04 MED ORDER — SODIUM CHLORIDE 0.9 % IV BOLUS (SEPSIS)
500.0000 mL | Freq: Once | INTRAVENOUS | Status: AC
  Administered 2015-08-04: 500 mL via INTRAVENOUS

## 2015-08-04 MED ORDER — FOSFOMYCIN TROMETHAMINE 3 G PO PACK
3.0000 g | PACK | ORAL | Status: AC
  Administered 2015-08-04: 3 g via ORAL
  Filled 2015-08-04: qty 3

## 2015-08-04 NOTE — ED Provider Notes (Signed)
[facility] Emergency Department Provider Note  ____________________________________________  Time seen: Approximately 7:26 PM  I have reviewed the triage vital signs and the nursing notes.   HISTORY  Chief Complaint Weakness  The patient has some dementia per her daughter, patient and the daughter both provide the history together.  HPI Denise Calderon is a 82 y.o. female presents for evaluation of some slight weakness. She has been feeling a little more tired than usual for about the last 3-4 days. She just generally feels a little bit unwell. She denies any new symptoms like weakness in arm or leg, trouble speaking, headache, fever chills trouble breathing. No abdominal pain or chest pain. She just feels a little bit tired. She is not ambulatory due to prior stroke. She did go to lunch today with her daughter and did well, she is been just slightly a little less for the last 2 days. Daughter reports she had similar symptoms that she thinks was a urinary tract infection about 6 months ago.   Past Medical History  Diagnosis Date  . Stroke (HCC)   . Pacemaker   . Hypertension   . Anemia     Patient Active Problem List   Diagnosis Date Noted  . Altered mental status 04/30/2015    History reviewed. No pertinent past surgical history.  Current Outpatient Rx  Name  Route  Sig  Dispense  Refill  . acetaminophen (MAPAP) 500 MG tablet   Oral   Take 1,000 mg by mouth 3 (three) times daily. Pt is also able to take every six hours as needed for pain.         . Calcium Carbonate-Vitamin D (CALCIUM-D) 600-400 MG-UNIT TABS   Oral   Take 1 tablet by mouth 2 (two) times daily with a meal.          . citalopram (CELEXA) 20 MG tablet   Oral   Take 20 mg by mouth daily.         . clopidogrel (PLAVIX) 75 MG tablet   Oral   Take 75 mg by mouth daily.         . fluticasone (FLONASE) 50 MCG/ACT nasal spray   Each Nare   Place 2 sprays into both  nostrils daily.          Marland Kitchen HYDROcodone-acetaminophen (NORCO/VICODIN) 5-325 MG tablet   Oral   Take 1 tablet by mouth every 6 (six) hours as needed for moderate pain.          . meclizine (ANTIVERT) 25 MG tablet   Oral   Take 25 mg by mouth 3 (three) times daily as needed for dizziness.         . mirtazapine (REMERON) 15 MG tablet   Oral   Take 15 mg by mouth at bedtime.         . Multiple Vitamins-Minerals (ICAPS AREDS FORMULA PO)   Oral   Take 1 tablet by mouth 2 (two) times daily.         Marland Kitchen oxybutynin (DITROPAN-XL) 10 MG 24 hr tablet   Oral   Take 10 mg by mouth daily.         . pantoprazole (PROTONIX) 40 MG tablet   Oral   Take 40 mg by mouth daily.         . fosfomycin (MONUROL) 3 g PACK   Oral   Take 3 g by mouth once. Please mix in 8 oz of water, take by mouth once  on 08/07/2015   3 g   0     Allergies Review of patient's allergies indicates no known allergies.  Family History  Problem Relation Age of Onset  . Heart attack Mother     Social History Social History  Substance Use Topics  . Smoking status: Never Smoker   . Smokeless tobacco: None  . Alcohol Use: No    Review of Systems Constitutional: No fever/chills Eyes: No visual changes. ENT: No sore throat. Cardiovascular: Denies chest pain. Respiratory: Denies shortness of breath. Gastrointestinal: No abdominal pain.  No nausea, no vomiting.  No diarrhea.  No constipation. Genitourinary: Negative for dysuria. Musculoskeletal: Negative for back pain. Skin: Negative for rash. Neurological: Negative for headaches, focal weakness or numbness.Chronic right-sided paralysis.  10-point ROS otherwise negative.  ____________________________________________   PHYSICAL EXAM:  VITAL SIGNS: ED Triage Vitals  Enc Vitals Group     BP 08/04/15 1845 172/84 mmHg     Pulse Rate 08/04/15 1845 78     Resp 08/04/15 1845 16     Temp 08/04/15 1845 97.5 F (36.4 C)     Temp Source 08/04/15 1845  Oral     SpO2 08/04/15 1848 96 %     Weight 08/04/15 1845 110 lb (49.896 kg)     Height 08/04/15 1845 5\' 4"  (1.626 m)     Head Cir --      Peak Flow --      Pain Score --      Pain Loc --      Pain Edu? --      Excl. in GC? --    Constitutional: Alert and orientedTo self, daughter, place and recent events but not to time which daughter states is normal. Well appearing and in no acute distress. She and daughter are both very pleasant and attentive. Eyes: Conjunctivae are normal. PERRL. EOMI. Head: Atraumatic. Nose: No congestion/rhinnorhea. Mouth/Throat: Mucous membranes are moist.  Oropharynx non-erythematous. Neck: No stridor.   Cardiovascular: Normal rate, regular rhythm. Grossly normal heart sounds.  Good peripheral circulation. Respiratory: Normal respiratory effort.  No retractions. Lungs CTAB. Gastrointestinal: Soft and nontender. No distention. No abdominal bruits. No CVA tenderness. Musculoskeletal: No lower extremity tenderness nor edema.  No joint effusions. Neurologic:  Normal speech and language. No gross focal neurologic deficits are appreciated except for right-sided hemiplegia which is chronic. Patient rolled backside examine no ulcers or lesions noted. Skin:  Skin is warm, dry and intact. No rash noted. Psychiatric: Mood and affect are normal. Speech and behavior are normal.  ____________________________________________   LABS (all labs ordered are listed, but only abnormal results are displayed)  Labs Reviewed  BASIC METABOLIC PANEL - Abnormal; Notable for the following:    Sodium 134 (*)    Chloride 100 (*)    All other components within normal limits  URINALYSIS COMPLETEWITH MICROSCOPIC ([facility] ONLY) - Abnormal; Notable for the following:    Color, Urine STRAW (*)    APPearance CLEAR (*)    Leukocytes, UA 1+ (*)    Squamous Epithelial / LPF 0-5 (*)    All other components within normal limits  URINE CULTURE  CBC    ____________________________________________  EKG  Reviewed and interpreted by me at 1850 Atrial sensed ventricular pacer at a rate of 70 QRS 200 QTC 5:30 Atrial sensed ventricular pacing. No acute abnormality ____________________________________________  RADIOLOGY  We discussed obtaining an x-ray and getting a CAT scan, but after discussing with patient and her daughter they really don't wish for  this. They don't feel that she's had any symptoms to indicate a need for a CAT scan such as new numbness tingling or weakness. She is not having a cough or trouble breathing. She not having a headache. I think this is reasonable, and we will not obtain imaging after our discussion via shared medical decision making. ____________________________________________   PROCEDURES  Procedure(s) performed: None  Critical Care performed: No  ____________________________________________   INITIAL IMPRESSION / ASSESSMENT AND PLAN / ED COURSE  Pertinent labs & imaging results that were available during my care of the patient were reviewed by me and considered in my medical decision making (see chart for details).  Patient presents for his generalized weakness. Overall very reassuring exam and appears to be alert, no alteration in sensorium, no evidence of a new or acute neurologic deficit. Afebrile. Labs very reassuring. She doesn't same symptoms in the past per her daughter when she had a mild UTI. This is possible, she does have leukocyte esterase and white cells noted in the urine which is notably pretty clean. We will send a culture, have discussed with him and we will treat with fosfomycin and they will monitor her symptoms closely.  ----------------------------------------- 9:07 PM on 08/04/2015 -----------------------------------------   patient is awake and alert, no distress. Daughter and patient Comfortable the plan for discharge and careful return precautions.  Return precautions  and treatment recommendations and follow-up discussed with the patient who is agreeable with the plan.  ____________________________________________   FINAL CLINICAL IMPRESSION(S) / ED DIAGNOSES  Final diagnoses:  General weakness  Urinary tract infection, acute      Sharyn Creamer, MD 08/04/15 2109

## 2015-08-04 NOTE — ED Notes (Signed)
Pt to ED from [facility] with generalized weakness x 3 days. Pt states "i just don't feel well" Pt denies any pain, chest pain, SOB, or any cough/cold/congestion. Pt drowsy on arrival, vitals stable. NAD noted at this time.

## 2015-08-04 NOTE — Discharge Instructions (Signed)
You have been seen in the Emergency Department (ED) today for weakness. Your workup today suggests that you could have a slight urinary tract infection (UTI).  Please take your antibiotic as prescribed on this Thursday (08/07/15).  Drink PLENTY of fluids.  Call your regular doctor to schedule the next available appointment to follow up on todays ED visit, or return immediately to the ED if your pain worsens, you have decreased urine production, develop fever, persistent vomiting, or other symptoms that concern you.   Urinary Tract Infection Urinary tract infections (UTIs) can develop anywhere along your urinary tract. Your urinary tract is your body's drainage system for removing wastes and extra water. Your urinary tract includes two kidneys, two ureters, a bladder, and a urethra. Your kidneys are a pair of bean-shaped organs. Each kidney is about the size of your fist. They are located below your ribs, one on each side of your spine. CAUSES Infections are caused by microbes, which are microscopic organisms, including fungi, viruses, and bacteria. These organisms are so small that they can only be seen through a microscope. Bacteria are the microbes that most commonly cause UTIs. SYMPTOMS  Symptoms of UTIs may vary by age and gender of the patient and by the location of the infection. Symptoms in young women typically include a frequent and intense urge to urinate and a painful, burning feeling in the bladder or urethra during urination. Older women and men are more likely to be tired, shaky, and weak and have muscle aches and abdominal pain. A fever may mean the infection is in your kidneys. Other symptoms of a kidney infection include pain in your back or sides below the ribs, nausea, and vomiting. DIAGNOSIS To diagnose a UTI, your caregiver will ask you about your symptoms. Your caregiver will also ask you to provide a urine sample. The urine sample will be tested for bacteria and white blood  cells. White blood cells are made by your body to help fight infection. TREATMENT  Typically, UTIs can be treated with medication. Because most UTIs are caused by a bacterial infection, they usually can be treated with the use of antibiotics. The choice of antibiotic and length of treatment depend on your symptoms and the type of bacteria causing your infection. HOME CARE INSTRUCTIONS  If you were prescribed antibiotics, take them exactly as your caregiver instructs you. Finish the medication even if you feel better after you have only taken some of the medication.  Drink enough water and fluids to keep your urine clear or pale yellow.  Avoid caffeine, tea, and carbonated beverages. They tend to irritate your bladder.  Empty your bladder often. Avoid holding urine for long periods of time.  Empty your bladder before and after sexual intercourse.  After a bowel movement, women should cleanse from front to back. Use each tissue only once. SEEK MEDICAL CARE IF:   You have back pain.  You develop a fever.  Your symptoms do not begin to resolve within 3 days. SEEK IMMEDIATE MEDICAL CARE IF:   You have severe back pain or lower abdominal pain.  You develop chills.  You have nausea or vomiting.  You have continued burning or discomfort with urination. MAKE SURE YOU:   Understand these instructions.  Will watch your condition.  Will get help right away if you are not doing well or get worse.   This information is not intended to replace advice given to you by your health care provider. Make sure you discuss any  questions you have with your health care provider.   Document Released: 02/10/2005 Document Revised: 01/22/2015 Document Reviewed: 06/11/2011 Elsevier Interactive Patient Education 2016 Elsevier Inc.  Weakness Weakness is a lack of strength. You may feel weak all over your body or just in one part of your body. Weakness can be serious. In some cases, you may need more  medical tests. HOME CARE  Rest.  Eat a well-balanced diet.  Try to exercise every day.  Only take medicines as told by your doctor. GET HELP RIGHT AWAY IF:   You cannot do your normal daily activities.  You cannot walk up and down stairs, or you feel very tired when you do so.  You have shortness of breath or chest pain.  You have trouble moving parts of your body.  You have weakness in only one body part or on only one side of the body.  You have a fever.  You have trouble speaking or swallowing.  You cannot control when you pee (urinate) or poop (bowel movement).  You have black or bloody throw up (vomit) or poop.  Your weakness gets worse or spreads to other body parts.  You have new aches or pains. MAKE SURE YOU:   Understand these instructions.  Will watch your condition.  Will get help right away if you are not doing well or get worse.   This information is not intended to replace advice given to you by your health care provider. Make sure you discuss any questions you have with your health care provider.   Document Released: 04/15/2008 Document Revised: 11/02/2011 Document Reviewed: 07/02/2011 Elsevier Interactive Patient Education [employer]

## 2015-08-04 NOTE — ED Notes (Signed)
MD Quale at bedside. 

## 2015-08-06 LAB — URINE CULTURE: SPECIAL REQUESTS: NORMAL

## 2015-10-14 ENCOUNTER — Encounter: Payer: Self-pay | Admitting: Emergency Medicine

## 2015-10-14 ENCOUNTER — Emergency Department
Admission: EM | Admit: 2015-10-14 | Discharge: 2015-10-14 | Disposition: A | Discharge status: Home / Self Care | Payer: Medicare Other | Attending: Emergency Medicine | Admitting: Emergency Medicine

## 2015-10-14 DIAGNOSIS — Z79899 Other long term (current) drug therapy: Secondary | ICD-10-CM | POA: Diagnosis not present

## 2015-10-14 DIAGNOSIS — Z950 Presence of cardiac pacemaker: Secondary | ICD-10-CM | POA: Insufficient documentation

## 2015-10-14 DIAGNOSIS — Y939 Activity, unspecified: Secondary | ICD-10-CM | POA: Insufficient documentation

## 2015-10-14 DIAGNOSIS — Y999 Unspecified external cause status: Secondary | ICD-10-CM | POA: Insufficient documentation

## 2015-10-14 DIAGNOSIS — W19XXXA Unspecified fall, initial encounter: Secondary | ICD-10-CM

## 2015-10-14 DIAGNOSIS — Y92239 Unspecified place in hospital as the place of occurrence of the external cause: Secondary | ICD-10-CM | POA: Diagnosis not present

## 2015-10-14 DIAGNOSIS — Z8673 Personal history of transient ischemic attack (TIA), and cerebral infarction without residual deficits: Secondary | ICD-10-CM | POA: Diagnosis not present

## 2015-10-14 DIAGNOSIS — I10 Essential (primary) hypertension: Secondary | ICD-10-CM | POA: Insufficient documentation

## 2015-10-14 DIAGNOSIS — W1839XA Other fall on same level, initial encounter: Secondary | ICD-10-CM | POA: Insufficient documentation

## 2015-10-14 DIAGNOSIS — Z048 Encounter for examination and observation for other specified reasons: Secondary | ICD-10-CM | POA: Insufficient documentation

## 2015-10-14 NOTE — ED Notes (Signed)
Discharge instructions reviewed with patient and daughter. Patient and daughter verbalized understanding. Patient taken to lobby via wheelchair and helped into vehicle. [facility] [place] and staff member given report.

## 2015-10-14 NOTE — ED Notes (Signed)
Pt arrived via EMS from [facility] after a fall from her bed to the floor. Pt has right sided paralysis after a CVA which the staff thinks contributed to her fall. Pt is alert to person, place and event. Pt reports that she was transferring from bed to wheelchair when she missed the wheelchair and fell. Pt denies any injury or pain, denies hitting head.

## 2015-10-14 NOTE — ED Provider Notes (Signed)
[facility] Emergency Department Provider Note ____________________________________________  Time seen: Approximately 8:47 PM  I have reviewed the triage vital signs and the nursing notes.   HISTORY  Chief Complaint Fall   HPI Denise Calderon is a 82 y.o. female who is at baseline paralyzed in her right side from a stroke 10 years ago and presents to the ER via EMS after she fell transferring herself from her wheelchair to her bed without waiting for assistance where she resides at the [employer] [facility]. She is here with her daughter and they do not think she sustained an injury. She denies any pain. It is protocol for a patient to come to the ER for evaluation.  She celebrated her 89th birthday and Friday with a large family party and has been well recently.  Past Medical History  Diagnosis Date  . Stroke (HCC)   . Pacemaker   . Hypertension   . Anemia     Patient Active Problem List   Diagnosis Date Noted  . Altered mental status 04/30/2015    No past surgical history on file.  Current Outpatient Rx  Name  Route  Sig  Dispense  Refill  . acetaminophen (MAPAP) 500 MG tablet   Oral   Take 1,000 mg by mouth 3 (three) times daily. Pt is also able to take every six hours as needed for pain.         . Calcium Carbonate-Vitamin D (CALCIUM-D) 600-400 MG-UNIT TABS   Oral   Take 1 tablet by mouth 2 (two) times daily with a meal.          . citalopram (CELEXA) 20 MG tablet   Oral   Take 20 mg by mouth daily.         . clopidogrel (PLAVIX) 75 MG tablet   Oral   Take 75 mg by mouth daily.         . fluticasone (FLONASE) 50 MCG/ACT nasal spray   Each Nare   Place 2 sprays into both nostrils daily.          . fosfomycin (MONUROL) 3 g PACK   Oral   Take 3 g by mouth once. Please mix in 8 oz of water, take by mouth once on 08/07/2015   3 g   0   . HYDROcodone-acetaminophen (NORCO/VICODIN) 5-325 MG tablet   Oral   Take 1 tablet by mouth  every 6 (six) hours as needed for moderate pain.          . meclizine (ANTIVERT) 25 MG tablet   Oral   Take 25 mg by mouth 3 (three) times daily as needed for dizziness.         . mirtazapine (REMERON) 15 MG tablet   Oral   Take 15 mg by mouth at bedtime.         . Multiple Vitamins-Minerals (ICAPS AREDS FORMULA PO)   Oral   Take 1 tablet by mouth 2 (two) times daily.         Marland Kitchen oxybutynin (DITROPAN-XL) 10 MG 24 hr tablet   Oral   Take 10 mg by mouth daily.         . pantoprazole (PROTONIX) 40 MG tablet   Oral   Take 40 mg by mouth daily.           Allergies Review of patient's allergies indicates no known allergies.  Family History  Problem Relation Age of Onset  . Heart attack Mother  Social History Social History  Substance Use Topics  . Smoking status: Never Smoker   . Smokeless tobacco: Not on file  . Alcohol Use: No    Review of Systems Constitutional: No fever Eyes: No visual changes. ENT: No sore throat. Cardiovascular: Denies chest pain. Respiratory: Denies shortness of breath. Gastrointestinal: No abdominal pain.   Musculoskeletal: Negative for back pain. Skin: Negative for rash. Neurological: Negative for headache  10-point ROS otherwise negative.  ____________________________________________   PHYSICAL EXAM:  VITAL SIGNS: ED Triage Vitals  Enc Vitals Group     BP 10/14/15 2046 166/69 mmHg     Pulse Rate 10/14/15 2046 75     Resp 10/14/15 2046 18     Temp 10/14/15 2046 97.8 F (36.6 C)     Temp Source 10/14/15 2046 Oral     SpO2 10/14/15 2046 98 %     Weight 10/14/15 2046 119 lb (53.978 kg)     Height 10/14/15 2046 5\' 4"  (1.626 m)     Head Cir --      Peak Flow --      Pain Score --      Pain Loc --      Pain Edu? --      Excl. in GC? --    Constitutional: Alert and oriented. Well appearing and in no acute distress. Eyes: Conjunctivae are normal. PERRL. EOMI. Head: Atraumatic. Nose: No  congestion/rhinnorhea. Mouth/Throat: Mucous membranes are moist.  Oropharynx non-erythematous. Neck: No stridor.   Cardiovascular: Normal rate, regular rhythm. Grossly normal heart sounds.  Good peripheral circulation. Respiratory: Normal respiratory effort.  No retractions. Lungs CTAB. Gastrointestinal: Soft and nontender. No distention. No abdominal bruits. No CVA tenderness. Musculoskeletal: No lower extremity tenderness nor edema. All joints were moved passively without any pain. Patient is able to move her left side on her own and is able to do so without pain. She does have a small bruise left medial knee but no focal bony tenderness, deformity, or swelling  Neurologic:  Normal speech and language. No New gross focal neurologic deficits are appreciated; paralyzed on the right side per baseline. No gait instability. Skin:  Skin is warm, dry and intact. No rash noted. Psychiatric: Mood and affect are normal. Speech and behavior are normal.  ____________________________________________   LABS (all labs ordered are listed, but only abnormal results are displayed)  Labs Reviewed - No data to display _____________________________________________   INITIAL IMPRESSION / ASSESSMENT AND PLAN / ED COURSE  Pertinent labs & imaging results that were available during my care of the patient were reviewed by me and considered in my medical decision making (see chart for details).  Patient without any injuries or need for radiography. ____________________________________________   FINAL CLINICAL IMPRESSION(S) / ED DIAGNOSES  Mechanical fall without injury.   New prescriptions started this visit New Prescriptions   No medications on file     Maurilio LovelyNoelle Deni Berti, MD 10/14/15 2110

## 2015-10-14 NOTE — Discharge Instructions (Signed)
Return to the ER if you find you have an injury that needs to be evaluated or for any other concerns.   Fall Prevention in Hospitals, Adult As a hospital patient, your condition and the treatments you receive can increase your risk for falls. Some additional risk factors for falls in a hospital include:  Being in an unfamiliar environment.  Being on bed rest.  Your surgery.  Taking certain medicines.  Your tubing requirements, such as intravenous (IV) therapy or catheters. It is important that you learn how to decrease fall risks while at the hospital. Below are important tips that can help prevent falls. SAFETY TIPS FOR PREVENTING FALLS Talk about your risk of falling.  Ask your health care provider why you are at risk for falling. Is it your medicine, illness, tubing placement, or something else?  Make a plan with your health care provider to keep you safe from falls.  Ask your health care provider or pharmacist about side effects of your medicines. Some medicines can make you dizzy or affect your coordination. Ask for help.  Ask for help before getting out of bed. You may need to press your call button.  Ask for assistance in getting safely to the toilet.  Ask for a walker or cane to be put at your bedside. Ask that most of the side rails on your bed be placed up before your health care provider leaves the room.  Ask family or friends to sit with you.  Ask for things that are out of your reach, such as your glasses, hearing aids, telephone, bedside table, or call button. Follow these tips to avoid falling:  Stay lying or seated, rather than standing, while waiting for help.  Wear rubber-soled slippers or shoes whenever you walk in the hospital.  Avoid quick, sudden movements.  Change positions slowly.  Sit on the side of your bed before standing.  Stand up slowly and wait before you start to walk.  Let your health care provider know if there is a spill on the  floor.  Pay careful attention to the medical equipment, electrical cords, and tubes around you.  When you need help, use your call button by your bed or in the bathroom. Wait for one of your health care providers to help you.  If you feel dizzy or unsure of your footing, return to bed and wait for assistance.  Avoid being distracted by the TV, telephone, or another person in your room.  Do not lean or support yourself on rolling objects, such as IV poles or bedside tables.   This information is not intended to replace advice given to you by your health care provider. Make sure you discuss any questions you have with your health care provider.   Document Released: 04/30/2000 Document Revised: 05/24/2014 Document Reviewed: 01/09/2012 Elsevier Interactive Patient Education [employer] Elsevier Inc.

## 2015-10-21 ENCOUNTER — Encounter
Admission: RE | Admit: 2015-10-21 | Discharge: 2015-10-21 | Disposition: A | Discharge status: Home / Self Care | Payer: Medicare Other | Source: Ambulatory Visit | Attending: Cardiology | Admitting: Cardiology

## 2015-10-21 ENCOUNTER — Ambulatory Visit
Admission: RE | Admit: 2015-10-21 | Discharge: 2015-10-21 | Disposition: A | Discharge status: Home / Self Care | Payer: Medicare Other | Source: Ambulatory Visit | Attending: Cardiology | Admitting: Cardiology

## 2015-10-21 DIAGNOSIS — M5134 Other intervertebral disc degeneration, thoracic region: Secondary | ICD-10-CM | POA: Diagnosis not present

## 2015-10-21 DIAGNOSIS — Z950 Presence of cardiac pacemaker: Secondary | ICD-10-CM

## 2015-10-21 DIAGNOSIS — M8580 Other specified disorders of bone density and structure, unspecified site: Secondary | ICD-10-CM | POA: Insufficient documentation

## 2015-10-21 DIAGNOSIS — Z01818 Encounter for other preprocedural examination: Secondary | ICD-10-CM | POA: Insufficient documentation

## 2015-10-21 DIAGNOSIS — Z952 Presence of prosthetic heart valve: Secondary | ICD-10-CM | POA: Insufficient documentation

## 2015-10-21 HISTORY — DX: Dizziness and giddiness: R42

## 2015-10-21 HISTORY — DX: Nonrheumatic aortic valve disorder, unspecified: I35.9

## 2015-10-21 LAB — SURGICAL PCR SCREEN
MRSA, PCR: NEGATIVE
STAPHYLOCOCCUS AUREUS: NEGATIVE

## 2015-10-21 NOTE — Patient Instructions (Addendum)
  Your procedure is scheduled on: 10/29/15 Wed Report to Same Day Surgery 2nd floor medical mall To find out your arrival time please call [phone] between 1PM - 3PM on 10/28/15 Tues  Remember: Instructions that are not followed completely may result in serious medical risk, up to and including death, or upon the discretion of your surgeon and anesthesiologist your surgery may need to be rescheduled.    _x___ 1. Do not eat food or drink liquids after midnight. No gum chewing or hard candies.     ____ 2. No Alcohol for 24 hours before or after surgery.   ____ 3. Bring all medications with you on the day of surgery if instructed.    __x__ 4. Notify your doctor if there is any change in your medical condition     (cold, fever, infections).     Do not wear jewelry, make-up, hairpins, clips or nail polish.  Do not wear lotions, powders, or perfumes. You may wear deodorant.  Do not shave 48 hours prior to surgery. Men may shave face and neck.  Do not bring valuables to the hospital.    [facility] is not responsible for any belongings or valuables.               Contacts, dentures or bridgework may not be worn into surgery.  Leave your suitcase in the car. After surgery it may be brought to your room.  For patients admitted to the hospital, discharge time is determined by your treatment team.   Patients discharged the day of surgery will not be allowed to drive home.    Please read over the following fact sheets that you were given:   [facility] Preparing for Surgery and or MRSA Information   _x___ Take these medicines the morning of surgery with A SIP OF WATER:    1. citalopram (CELEXA) 20 MG tablet  2.mirtazapine (REMERON) 15 MG tablet  3.pantoprazole (PROTONIX) 40 MG tablet  4.  5.  6.  ____ Fleet Enema (as directed)   _x___ Use CHG Soap or sage wipes as directed on instruction sheet   ____ Use inhalers on the day of surgery and bring to hospital day of surgery  ____  Stop metformin 2 days prior to surgery    ____ Take 1/2 of usual insulin dose the night before surgery and none on the morning of           surgery.   __x__ Stop aspirin or coumadin, or plavix Stop plavix 7 days before surgery if OK with Dr Lady GaryFath  _x__ Stop Anti-inflammatories such as Advil, Aleve, Ibuprofen, Motrin, Naproxen,          Naprosyn, Goodies powders or aspirin products. Ok to take Tylenol.   ____ Stop supplements until after surgery.    ____ Bring C-Pap to the hospital.

## 2015-10-29 ENCOUNTER — Encounter: Payer: Self-pay | Admitting: *Deleted

## 2015-10-29 ENCOUNTER — Ambulatory Visit: Payer: Medicare Other | Admitting: Registered Nurse

## 2015-10-29 ENCOUNTER — Encounter
Admission: RE | Disposition: A | Discharge status: Home / Self Care | Payer: Self-pay | Source: Ambulatory Visit | Attending: Cardiology

## 2015-10-29 ENCOUNTER — Ambulatory Visit
Admission: RE | Admit: 2015-10-29 | Discharge: 2015-10-29 | Disposition: A | Discharge status: Home / Self Care | Payer: Medicare Other | Source: Ambulatory Visit | Attending: Cardiology | Admitting: Cardiology

## 2015-10-29 DIAGNOSIS — Z7902 Long term (current) use of antithrombotics/antiplatelets: Secondary | ICD-10-CM | POA: Diagnosis not present

## 2015-10-29 DIAGNOSIS — Z952 Presence of prosthetic heart valve: Secondary | ICD-10-CM | POA: Diagnosis not present

## 2015-10-29 DIAGNOSIS — Z79899 Other long term (current) drug therapy: Secondary | ICD-10-CM | POA: Insufficient documentation

## 2015-10-29 DIAGNOSIS — I10 Essential (primary) hypertension: Secondary | ICD-10-CM | POA: Insufficient documentation

## 2015-10-29 DIAGNOSIS — Z7951 Long term (current) use of inhaled steroids: Secondary | ICD-10-CM | POA: Diagnosis not present

## 2015-10-29 DIAGNOSIS — Z87891 Personal history of nicotine dependence: Secondary | ICD-10-CM | POA: Diagnosis not present

## 2015-10-29 DIAGNOSIS — Z7982 Long term (current) use of aspirin: Secondary | ICD-10-CM | POA: Insufficient documentation

## 2015-10-29 DIAGNOSIS — Z8673 Personal history of transient ischemic attack (TIA), and cerebral infarction without residual deficits: Secondary | ICD-10-CM | POA: Insufficient documentation

## 2015-10-29 DIAGNOSIS — I442 Atrioventricular block, complete: Secondary | ICD-10-CM | POA: Diagnosis present

## 2015-10-29 DIAGNOSIS — M1990 Unspecified osteoarthritis, unspecified site: Secondary | ICD-10-CM | POA: Diagnosis not present

## 2015-10-29 DIAGNOSIS — Z9889 Other specified postprocedural states: Secondary | ICD-10-CM | POA: Diagnosis not present

## 2015-10-29 DIAGNOSIS — Z8249 Family history of ischemic heart disease and other diseases of the circulatory system: Secondary | ICD-10-CM | POA: Diagnosis not present

## 2015-10-29 DIAGNOSIS — E785 Hyperlipidemia, unspecified: Secondary | ICD-10-CM | POA: Insufficient documentation

## 2015-10-29 DIAGNOSIS — I2510 Atherosclerotic heart disease of native coronary artery without angina pectoris: Secondary | ICD-10-CM | POA: Diagnosis not present

## 2015-10-29 DIAGNOSIS — M810 Age-related osteoporosis without current pathological fracture: Secondary | ICD-10-CM | POA: Diagnosis not present

## 2015-10-29 DIAGNOSIS — I739 Peripheral vascular disease, unspecified: Secondary | ICD-10-CM | POA: Insufficient documentation

## 2015-10-29 DIAGNOSIS — Z791 Long term (current) use of non-steroidal anti-inflammatories (NSAID): Secondary | ICD-10-CM | POA: Diagnosis not present

## 2015-10-29 DIAGNOSIS — Z45010 Encounter for checking and testing of cardiac pacemaker pulse generator [battery]: Secondary | ICD-10-CM | POA: Diagnosis not present

## 2015-10-29 DIAGNOSIS — K219 Gastro-esophageal reflux disease without esophagitis: Secondary | ICD-10-CM | POA: Insufficient documentation

## 2015-10-29 HISTORY — PX: PACEMAKER LEAD REMOVAL: SHX5064

## 2015-10-29 SURGERY — REMOVAL, ELECTRODE LEAD, CARDIAC PACEMAKER, WITHOUT REPLACEMENT
Anesthesia: General

## 2015-10-29 MED ORDER — PROPOFOL 10 MG/ML IV BOLUS
INTRAVENOUS | Status: DC | PRN
  Administered 2015-10-29: 10 mg via INTRAVENOUS

## 2015-10-29 MED ORDER — CEPHALEXIN 250 MG PO CAPS: 250.0000 mg | ORAL_CAPSULE | Freq: Four times a day (QID) | ORAL | Status: DC

## 2015-10-29 MED ORDER — GENTAMICIN SULFATE 40 MG/ML IJ SOLN: 80.0000 mg | INTRAMUSCULAR | Status: DC

## 2015-10-29 MED ORDER — CEFAZOLIN SODIUM 1-5 GM-% IV SOLN
1.0000 g | Freq: Once | INTRAVENOUS | Status: AC
  Administered 2015-10-29: 1 g via INTRAVENOUS

## 2015-10-29 MED ORDER — LACTATED RINGERS IV SOLN
INTRAVENOUS | Status: DC
Start: 2015-10-29 — End: 2015-10-29
  Administered 2015-10-29: 12:00:00 via INTRAVENOUS

## 2015-10-29 MED ORDER — SODIUM CHLORIDE 0.9 % IR SOLN: Freq: Once | Status: DC

## 2015-10-29 MED ORDER — PROPOFOL 500 MG/50ML IV EMUL
INTRAVENOUS | Status: DC | PRN
  Administered 2015-10-29: 40 ug/kg/min via INTRAVENOUS

## 2015-10-29 MED ORDER — SODIUM CHLORIDE 0.9 % IV SOLN: INTRAVENOUS | Status: DC

## 2015-10-29 MED ORDER — SODIUM CHLORIDE 0.9 % IR SOLN
Status: DC | PRN
  Administered 2015-10-29: 100 mL

## 2015-10-29 MED ORDER — FENTANYL CITRATE (PF) 100 MCG/2ML IJ SOLN: 25.0000 ug | INTRAMUSCULAR | Status: DC | PRN

## 2015-10-29 MED ORDER — CEFAZOLIN SODIUM-DEXTROSE 2-4 GM/100ML-% IV SOLN: 2.0000 g | INTRAVENOUS | Status: DC

## 2015-10-29 MED ORDER — LIDOCAINE HCL 1 % IJ SOLN
INTRAMUSCULAR | Status: DC | PRN
  Administered 2015-10-29: 20 mL

## 2015-10-29 MED ORDER — ONDANSETRON HCL 4 MG/2ML IJ SOLN: 4.0000 mg | Freq: Once | INTRAMUSCULAR | Status: DC | PRN

## 2015-10-29 SURGICAL SUPPLY — 27 items
CABLE SURG 12 DISP A/V CHANNEL (MISCELLANEOUS) ×2 IMPLANT
CANISTER SUCT 1200ML W/VALVE (MISCELLANEOUS) ×2 IMPLANT
CHLORAPREP W/TINT 26ML (MISCELLANEOUS) ×2 IMPLANT
COVER LIGHT HANDLE STERIS (MISCELLANEOUS) ×4 IMPLANT
COVER MAYO STAND STRL (DRAPES) ×2 IMPLANT
DECANTER SPIKE VIAL GLASS SM (MISCELLANEOUS) ×2 IMPLANT
DEVICE DISSECT PLASMABLAD 3.0S (MISCELLANEOUS) ×1 IMPLANT
DRESSING TELFA 4X3 1S ST N-ADH (GAUZE/BANDAGES/DRESSINGS) ×2 IMPLANT
ELECT REM PT RETURN 9FT ADLT (ELECTROSURGICAL) ×2
ELECTRODE REM PT RTRN 9FT ADLT (ELECTROSURGICAL) ×1 IMPLANT
GLOVE BIO SURGEON STRL SZ7.5 (GLOVE) ×4 IMPLANT
GLOVE BIO SURGEON STRL SZ8 (GLOVE) ×4 IMPLANT
GOWN STRL REUS W/ TWL LRG LVL3 (GOWN DISPOSABLE) ×1 IMPLANT
GOWN STRL REUS W/ TWL XL LVL3 (GOWN DISPOSABLE) ×1 IMPLANT
GOWN STRL REUS W/TWL LRG LVL3 (GOWN DISPOSABLE) ×1
GOWN STRL REUS W/TWL XL LVL3 (GOWN DISPOSABLE) ×1
KIT RM TURNOVER STRD PROC AR (KITS) ×2 IMPLANT
LABEL OR SOLS (LABEL) ×2 IMPLANT
MARKER SKIN DUAL TIP RULER LAB (MISCELLANEOUS) ×2 IMPLANT
NEEDLE FILTER BLUNT 18X 1/2SAF (NEEDLE) ×1
NEEDLE FILTER BLUNT 18X1 1/2 (NEEDLE) ×1 IMPLANT
NS IRRIG 500ML POUR BTL (IV SOLUTION) ×2 IMPLANT
PACEMAKER ADAPTA DR ADDR01 (Pacemaker) ×1 IMPLANT
PACK PACE INSERTION (MISCELLANEOUS) ×2 IMPLANT
PAD STATPAD (MISCELLANEOUS) ×2 IMPLANT
PLASMABLADE 3.0S (MISCELLANEOUS) ×2
PPM ADAPTA DR ADDR01 (Pacemaker) ×2 IMPLANT

## 2015-10-29 NOTE — Anesthesia Preprocedure Evaluation (Addendum)
Anesthesia Evaluation  Patient identified by MRN, date of birth, ID band Patient awake    Reviewed: Allergy & Precautions, H&P , NPO status , Patient's Chart, lab work & pertinent test results, reviewed documented beta blocker date and time   History of Anesthesia Complications Negative for: history of anesthetic complications  Airway Mallampati: III  TM Distance: >3 FB Neck ROM: full    Dental no notable dental hx. (+) Partial Lower, Missing, Poor Dentition   Pulmonary neg pulmonary ROS, former smoker,    Pulmonary exam normal breath sounds clear to auscultation       Cardiovascular Exercise Tolerance: Good hypertension, (-) angina(-) CAD, (-) Past MI, (-) Cardiac Stents and (-) CABG Normal cardiovascular exam+ dysrhythmias + pacemaker + Valvular Problems/Murmurs (s/p AVR)  Rhythm:regular Rate:Normal     Neuro/Psych neg Seizures CVA (right sided paralysis), Residual Symptoms negative psych ROS   GI/Hepatic negative GI ROS, Neg liver ROS,   Endo/Other  negative endocrine ROS  Renal/GU negative Renal ROS  negative genitourinary   Musculoskeletal   Abdominal   Peds  Hematology  (+) Blood dyscrasia, anemia ,   Anesthesia Other Findings Past Medical History:   Stroke [facility](HCC)                                                 Pacemaker                                                    Hypertension                                                 Anemia                                                       Vertigo                                                      Aortic valve defect                                            Comment:Aortic valve replaced   Reproductive/Obstetrics negative OB ROS                            Anesthesia Physical Anesthesia Plan  ASA: III  Anesthesia Plan: General   Post-op Pain Management:    Induction:   Airway Management Planned:   Additional Equipment:    Intra-op Plan:   Post-operative Plan:   Informed Consent: I have reviewed the patients History and Physical, chart, labs and discussed the procedure  including the risks, benefits and alternatives for the proposed anesthesia with the patient or authorized representative who has indicated his/her understanding and acceptance.   Dental Advisory Given  Plan Discussed with: Anesthesiologist, CRNA and Surgeon  Anesthesia Plan Comments:        Anesthesia Quick Evaluation

## 2015-10-29 NOTE — Op Note (Signed)
[facility]   10/29/2015                     12:55 PM  PATIENT:  Denise Calderon    PRE-OPERATIVE DIAGNOSIS:  atrioventricular block complete  POST-OPERATIVE DIAGNOSIS:  Same  PROCEDURE:  PACEMAKER generator change out  SURGEON:  Laiya Wisby, MD    ANESTHESIA:     PREOPERATIVE INDICATIONS:  Denise Calderon is a  82 y.o. female with a diagnosis of atrioventricular block complete who failed conservative measures and elected for surgical management.    The risks benefits and alternatives were discussed with the patient preoperatively including but not limited to the risks of infection, bleeding, cardiopulmonary complications, the need for revision surgery, among others, and the patient was willing to proceed.   OPERATIVE PROCEDURE: The patient was brought to the operating room and passed instate. The left pectoral region was prepped and draped in usual sterile manner. Anesthesia was obtained 1% Xylocaine locally  A 6 cm incision was performed of the left pectoral region. The old pacemaker generator was retrieved electrocautery and blunt dissection. The leads were disconnected and connected to a new temporary response a pacemaker generator (Medtronic Adapta ADDRO 1 ). The pacemaker pocket was irrigated with gentamicin solution. The new pacemaker generator was positioned a pocket and the pocket was closed with 2-0 and 4-0 Vicryl, respectively. Steri-Strips and pressure dressing were applied.

## 2015-10-29 NOTE — Progress Notes (Signed)
Dressing dry on discharge and no complaints of pain

## 2015-10-29 NOTE — Discharge Instructions (Signed)
Resume Plavix 10/31/2015  AMBULATORY SURGERY  DISCHARGE INSTRUCTIONS   1) The drugs that you were given will stay in your system until tomorrow so for the next 24 hours you should not:  A) Drive an automobile B) Make any legal decisions C) Drink any alcoholic beverage   2) You may resume regular meals tomorrow.  Today it is better to start with liquids and gradually work up to solid foods.  You may eat anything you prefer, but it is better to start with liquids, then soup and crackers, and gradually work up to solid foods.   3) Please notify your doctor immediately if you have any unusual bleeding, trouble breathing, redness and pain at the surgery site, drainage, fever, or pain not relieved by medication.    4) Additional Instructions:        Please contact your physician with any problems or Same Day Surgery at [phone], Monday through Friday 6 am to 4 pm, or [facility] at [facility] Main number at [phone].

## 2015-10-29 NOTE — Anesthesia Procedure Notes (Signed)
Date/Time: 10/29/2015 12:06 PM Performed by: Stormy FabianURTIS, Rohnan Bartleson Pre-anesthesia Checklist: Patient identified, Emergency Drugs available, Suction available and Patient being monitored Patient Re-evaluated:Patient Re-evaluated prior to inductionOxygen Delivery Method: Nasal cannula Intubation Type: IV induction Dental Injury: Teeth and Oropharynx as per pre-operative assessment  Comments: Nasal cannula with etCO2 monitoring

## 2015-10-29 NOTE — Anesthesia Postprocedure Evaluation (Signed)
Anesthesia Post Note  Patient: Hale Dronennie W Dixson  Procedure(s) Performed: Procedure(s) (LRB): PACEMAKER generator change out (N/A)  Patient location during evaluation: PACU Anesthesia Type: General Level of consciousness: awake and alert Pain management: pain level controlled Vital Signs Assessment: post-procedure vital signs reviewed and stable Respiratory status: spontaneous breathing, nonlabored ventilation, respiratory function stable and patient connected to nasal cannula oxygen Cardiovascular status: blood pressure returned to baseline and stable Postop Assessment: no signs of nausea or vomiting Anesthetic complications: no    Last Vitals:  Filed Vitals:   10/29/15 1340 10/29/15 1400  BP: 153/60 150/70  Pulse: 70 69  Temp: 35.7 C   Resp: 16     Last Pain: There were no vitals filed for this visit.               Lenard SimmerAndrew Chandlar Staebell

## 2015-10-29 NOTE — Transfer of Care (Signed)
Immediate Anesthesia Transfer of Care Note  Patient: Denise Calderon  Procedure(s) Performed: Procedure(s): PACEMAKER generator change out (N/A)  Patient Location: PACU  Anesthesia Type:General  Level of Consciousness: sedated  Airway & Oxygen Therapy: Patient Spontanous Breathing and Patient connected to face mask oxygen  Post-op Assessment: Report given to RN and Post -op Vital signs reviewed and stable  Post vital signs: Reviewed and stable  Last Vitals:  Filed Vitals:   10/29/15 1124 10/29/15 1302  BP: 160/68 156/71  Pulse: 68 70  Temp: 36.6 C 36.2 C  Resp: 20 20    Complications: No apparent anesthesia complications

## 2015-10-30 ENCOUNTER — Encounter: Payer: Self-pay | Admitting: Cardiology

## 2016-01-30 ENCOUNTER — Encounter (INDEPENDENT_AMBULATORY_CARE_PROVIDER_SITE_OTHER): Payer: Self-pay

## 2016-02-25 ENCOUNTER — Other Ambulatory Visit (INDEPENDENT_AMBULATORY_CARE_PROVIDER_SITE_OTHER): Payer: Self-pay | Admitting: Vascular Surgery

## 2016-02-25 DIAGNOSIS — I6523 Occlusion and stenosis of bilateral carotid arteries: Secondary | ICD-10-CM

## 2016-02-26 ENCOUNTER — Encounter (INDEPENDENT_AMBULATORY_CARE_PROVIDER_SITE_OTHER): Payer: Self-pay | Admitting: Vascular Surgery

## 2016-02-26 ENCOUNTER — Ambulatory Visit (INDEPENDENT_AMBULATORY_CARE_PROVIDER_SITE_OTHER): Payer: Medicare Other

## 2016-02-26 ENCOUNTER — Ambulatory Visit (INDEPENDENT_AMBULATORY_CARE_PROVIDER_SITE_OTHER): Payer: Medicare Other | Admitting: Vascular Surgery

## 2016-02-26 DIAGNOSIS — H8113 Benign paroxysmal vertigo, bilateral: Secondary | ICD-10-CM | POA: Diagnosis not present

## 2016-02-26 DIAGNOSIS — I63231 Cerebral infarction due to unspecified occlusion or stenosis of right carotid arteries: Secondary | ICD-10-CM

## 2016-02-26 DIAGNOSIS — I10 Essential (primary) hypertension: Secondary | ICD-10-CM | POA: Diagnosis not present

## 2016-02-26 DIAGNOSIS — E785 Hyperlipidemia, unspecified: Secondary | ICD-10-CM | POA: Insufficient documentation

## 2016-02-26 DIAGNOSIS — E782 Mixed hyperlipidemia: Secondary | ICD-10-CM | POA: Diagnosis not present

## 2016-02-26 DIAGNOSIS — I6523 Occlusion and stenosis of bilateral carotid arteries: Secondary | ICD-10-CM

## 2016-02-26 DIAGNOSIS — H8110 Benign paroxysmal vertigo, unspecified ear: Secondary | ICD-10-CM | POA: Insufficient documentation

## 2016-02-26 DIAGNOSIS — I63232 Cerebral infarction due to unspecified occlusion or stenosis of left carotid arteries: Secondary | ICD-10-CM

## 2016-02-26 DIAGNOSIS — I639 Cerebral infarction, unspecified: Secondary | ICD-10-CM | POA: Insufficient documentation

## 2016-02-26 DIAGNOSIS — I63239 Cerebral infarction due to unspecified occlusion or stenosis of unspecified carotid arteries: Secondary | ICD-10-CM | POA: Insufficient documentation

## 2016-02-26 NOTE — Progress Notes (Signed)
MRN : [id number]  Denise Calderon is a 82 y.o. (1923/12/22) female who presents with chief complaint of  Chief Complaint  Patient presents with  . Carotid    Ultrasound follow up  .  History of Present Illness:  The patient is seen for follow up evaluation of carotid stenosis. The carotid stenosis followed by ultrasound.  The patient denies amaurosis fugax. There is no recent history of TIA symptoms or focal motor deficits. There is no prior documented CVA.  The patient is taking enteric-coated aspirin 81 mg daily.  There is no history of migraine headaches. There is no history of seizures.  Her daughter notes episodes of vertigo which are a chronic problem have been occurring  With increasing frequency.  BP and heart rate is checked during these episodes and they are within the normal range.  The patient has a history of coronary artery disease, no recent episodes of angina or shortness of breath. The patient denies PAD or claudication symptoms. There is a history of hyperlipidemia which is being treated with a statin.  Duplex US of the carotid arteries: RICA patent stent and LICA <50%  Current Outpatient Prescriptions  Medication Sig Dispense Refill  . acetaminophen (MAPAP) 500 MG tablet Take 1,000 mg by mouth 3 (three) times daily. Pt is also able to take every six hours as needed for pain.    . Calcium Carbonate-Vitamin D (CALCIUM-D) 600-400 MG-UNIT TABS Take 1 tablet by mouth 2 (two) times daily with a meal.     . carboxymethylcellulose (REFRESH PLUS) 0.5 % SOLN 1 drop 3 (three) times daily as needed.    . cephALEXin (KEFLEX) 250 MG capsule Take 1 capsule (250 mg total) by mouth 4 (four) times daily. 28 capsule 0  . citalopram (CELEXA) 20 MG tablet Take 20 mg by mouth daily.    . clopidogrel (PLAVIX) 75 MG tablet Take 75 mg by mouth daily.    . diclofenac sodium (VOLTAREN) 1 % GEL Apply topically 4 (four) times daily as needed.    . fluticasone (FLONASE) 50 MCG/ACT nasal  spray Place 2 sprays into both nostrils daily.     Marland Kitchen. HYDROcodone-acetaminophen (NORCO/VICODIN) 5-325 MG tablet Take 1 tablet by mouth every 6 (six) hours as needed for moderate pain.     . meclizine (ANTIVERT) 25 MG tablet Take 25 mg by mouth 3 (three) times daily as needed for dizziness.    . mirtazapine (REMERON) 15 MG tablet Take 15 mg by mouth at bedtime.    . Multiple Vitamins-Minerals (ICAPS AREDS FORMULA PO) Take 1 tablet by mouth 2 (two) times daily.    Marland Kitchen. oxybutynin (DITROPAN-XL) 10 MG 24 hr tablet Take 10 mg by mouth daily.    . pantoprazole (PROTONIX) 40 MG tablet Take 40 mg by mouth daily.     No current facility-administered medications for this visit.     Past Medical History:  Diagnosis Date  . Anemia   . Aortic valve defect    Aortic valve replaced  . Hypertension   . Pacemaker   . Stroke (HCC)   . Vertigo     Past Surgical History:  Procedure Laterality Date  . aortic valve replaced    . CAROTID STENT Left   . CTR Left   . HIP FRACTURE SURGERY Right   . PACEMAKER LEAD REMOVAL N/A 10/29/2015   Procedure: PACEMAKER generator change out;  Surgeon: Marcina MillardAlexander Paraschos, MD;  Location: [facility] ORS;  Service: Cardiovascular;  Laterality: N/A;  Social History Social History  Substance Use Topics  . Smoking status: Former Smoker    Quit date: 10/20/1973  . Smokeless tobacco: Not on file  . Alcohol use No     No Known Allergies   REVIEW OF SYSTEMS (Negative unless checked)  Constitutional: [] Weight loss  [] Fever  [] Chills Cardiac: [] Chest pain   [] Chest pressure   [] Palpitations   [] Shortness of breath when laying flat   [] Shortness of breath at rest   [] Shortness of breath with exertion. Vascular:  [x] Pain in legs with walking   [] Pain in legs at rest   [] History of DVT   [] Phlebitis   [] Swelling in legs   [] Varicose veins Pulmonary:   [] Uses home oxygen   [] Productive cough   [] Hemoptysis   [] Wheeze  [] COPD   [] Asthma Neurologic:  [] Dizziness  [] Blackouts    [] Seizures   [x] History of stroke   [] History of TIA  [] Aphasia   [] Temporary blindness   [] Dysphagia   [x] Weakness or numbness in arms   [x] Weakness or numbness in legs Musculoskeletal:  [] Arthritis   [] Joint swelling   [x] Joint pain   [] Low back pain Hematologic:  [] Easy bruising  [] Easy bleeding   [] Hypercoagulable state   [] Anemic   Gastrointestinal:   [] Vomiting  [] Gastroesophageal reflux/heartburn   [] Abdominal pain Genitourinary:  [] Chronic kidney disease   [] Difficult urination  [] Frequent urination  [] Burning with urination   [] Hematuria Skin:  [] Rashes   [] Ulcers   [] Wounds Psychological:  []  anxiety   []  depression.  Physical Examination  BP 136/78 (BP Location: Right Arm)   Pulse 75   Resp 16   Ht 5\' 2"  (1.575 m)   Wt 110 lb (49.9 kg)   BMI 20.12 kg/m  Gen:  WD/WN, NAD Head: [place]/AT, No temporalis wasting. Ear/Nose/Throat: Hearing grossly intact, nares w/o erythema or drainage, trachea midline Eyes: PERRLA.  Sclera non-icteric Neck: Supple.  No JVD.  Pulmonary:  Good air movement, no use of accessory muscles. Clear bilaterally Cardiac: RRR, normal S1, S2 Vascular:  Bilateral carotid bruits Vessel Right Left  Radial Palpable Palpable  Ulnar Palpable Palpable  Brachial Palpable Palpable  Carotid Palpable, without bruit Palpable, without bruit   Gastrointestinal: Non-tender/non-distended. No guarding.  Musculoskeletal: M/S 5/5 throughout.  No deformity or atrophy.  Neurologic: CN 2-12 intact. Pain and light touch intact in extremities.  Symmetrical.  Speech is fluent.  Psychiatric: Judgment intact, Mood & affect appropriate for pt's clinical situation. Dermatologic: No rashes or ulcers noted.  No cellulitis or open wounds.    Labs No results found for this or any previous visit (from the past 2160 hour(s)).  Radiology No results found.  Assessment/Plan  1.  Carotid artery occlusion with infarction (433.11) Recommend:  Given the patient's asymptomatic  subcritical stenosis no further invasive testing or surgery at this time.  Continue antiplatelet therapy as prescribed Continue management of CAD, HTN and Hyperlipidemia Healthy heart diet, encouraged exercise at least 4 times per week Follow up in 6 months with duplex ultrasound and physical exam based on <50% stenosis of the Lt carotid artery  2.  Vertigo If symptoms continue to worsen then I recommend reevaluation by ENT.  3.  Hyperlipemia (272.4) Continue statin therapy  4.  HYPERTENSION, BENIGN ESSENTIAL (401.1) Medications reviewed and no changes at this time  5.  Arrhythmia (427.9)  6.  Cerebrovascular Accident   Levora Dredge, MD  02/26/2016 11:47 AM    This note was created with Dragon medical transcription system.  Any errors  from dictation are purely unintentional

## 2016-10-23 ENCOUNTER — Observation Stay
Admit: 2016-10-23 | Discharge: 2016-10-23 | Disposition: A | Discharge status: Home / Self Care | Payer: Medicare Other | Attending: Internal Medicine | Admitting: Internal Medicine

## 2016-10-23 ENCOUNTER — Observation Stay
Admission: EM | Admit: 2016-10-23 | Discharge: 2016-10-24 | Disposition: A | Discharge status: Home / Self Care | Payer: Medicare Other | Attending: Internal Medicine | Admitting: Internal Medicine

## 2016-10-23 ENCOUNTER — Encounter: Payer: Self-pay | Admitting: Emergency Medicine

## 2016-10-23 ENCOUNTER — Observation Stay: Payer: Medicare Other

## 2016-10-23 ENCOUNTER — Emergency Department: Payer: Medicare Other

## 2016-10-23 DIAGNOSIS — Z952 Presence of prosthetic heart valve: Secondary | ICD-10-CM | POA: Insufficient documentation

## 2016-10-23 DIAGNOSIS — I69320 Aphasia following cerebral infarction: Secondary | ICD-10-CM | POA: Diagnosis not present

## 2016-10-23 DIAGNOSIS — R4701 Aphasia: Secondary | ICD-10-CM

## 2016-10-23 DIAGNOSIS — E785 Hyperlipidemia, unspecified: Secondary | ICD-10-CM | POA: Insufficient documentation

## 2016-10-23 DIAGNOSIS — I69351 Hemiplegia and hemiparesis following cerebral infarction affecting right dominant side: Secondary | ICD-10-CM | POA: Insufficient documentation

## 2016-10-23 DIAGNOSIS — Z7902 Long term (current) use of antithrombotics/antiplatelets: Secondary | ICD-10-CM | POA: Diagnosis not present

## 2016-10-23 DIAGNOSIS — Z950 Presence of cardiac pacemaker: Secondary | ICD-10-CM | POA: Diagnosis not present

## 2016-10-23 DIAGNOSIS — F329 Major depressive disorder, single episode, unspecified: Secondary | ICD-10-CM | POA: Diagnosis not present

## 2016-10-23 DIAGNOSIS — K219 Gastro-esophageal reflux disease without esophagitis: Secondary | ICD-10-CM | POA: Diagnosis not present

## 2016-10-23 DIAGNOSIS — G459 Transient cerebral ischemic attack, unspecified: Principal | ICD-10-CM | POA: Diagnosis present

## 2016-10-23 DIAGNOSIS — I2510 Atherosclerotic heart disease of native coronary artery without angina pectoris: Secondary | ICD-10-CM | POA: Diagnosis not present

## 2016-10-23 DIAGNOSIS — Z87891 Personal history of nicotine dependence: Secondary | ICD-10-CM | POA: Insufficient documentation

## 2016-10-23 DIAGNOSIS — I10 Essential (primary) hypertension: Secondary | ICD-10-CM | POA: Diagnosis not present

## 2016-10-23 DIAGNOSIS — Z792 Long term (current) use of antibiotics: Secondary | ICD-10-CM | POA: Insufficient documentation

## 2016-10-23 DIAGNOSIS — Z79899 Other long term (current) drug therapy: Secondary | ICD-10-CM | POA: Insufficient documentation

## 2016-10-23 DIAGNOSIS — I639 Cerebral infarction, unspecified: Secondary | ICD-10-CM

## 2016-10-23 LAB — COMPREHENSIVE METABOLIC PANEL
ALK PHOS: 81 U/L (ref 38–126)
ALT: 12 U/L — ABNORMAL LOW (ref 14–54)
AST: 25 U/L (ref 15–41)
Albumin: 3.9 g/dL (ref 3.5–5.0)
Anion gap: 9 (ref 5–15)
BILIRUBIN TOTAL: 0.7 mg/dL (ref 0.3–1.2)
BUN: 12 mg/dL (ref 6–20)
CALCIUM: 9 mg/dL (ref 8.9–10.3)
CO2: 29 mmol/L (ref 22–32)
CREATININE: 0.63 mg/dL (ref 0.44–1.00)
Chloride: 100 mmol/L — ABNORMAL LOW (ref 101–111)
GFR calc Af Amer: >60 mL/min (ref >60)
Glucose, Bld: 84 mg/dL (ref 65–99)
POTASSIUM: 3.7 mmol/L (ref 3.5–5.1)
Sodium: 138 mmol/L (ref 135–145)
TOTAL PROTEIN: 7.2 g/dL (ref 6.5–8.1)

## 2016-10-23 LAB — PROTIME-INR
INR: 1.06
Prothrombin Time: 13.8 seconds (ref 11.4–15.2)

## 2016-10-23 LAB — CBC
HEMATOCRIT: 39 % (ref 35.0–47.0)
HEMOGLOBIN: 13.4 g/dL (ref 12.0–16.0)
MCH: 33.2 pg (ref 26.0–34.0)
MCHC: 34.5 g/dL (ref 32.0–36.0)
MCV: 96.2 fL (ref 80.0–100.0)
Platelets: 215 10*3/uL (ref 150–440)
RBC: 4.05 MIL/uL (ref 3.80–5.20)
RDW: 12.9 % (ref 11.5–14.5)
WBC: 10 10*3/uL (ref 3.6–11.0)

## 2016-10-23 LAB — URINALYSIS, ROUTINE W REFLEX MICROSCOPIC
Bilirubin Urine: NEGATIVE
GLUCOSE, UA: NEGATIVE mg/dL
Hgb urine dipstick: NEGATIVE
Ketones, ur: NEGATIVE mg/dL
Nitrite: NEGATIVE
PH: 6 (ref 5.0–8.0)
Protein, ur: NEGATIVE mg/dL
SPECIFIC GRAVITY, URINE: 1.009 (ref 1.005–1.030)

## 2016-10-23 LAB — ECHOCARDIOGRAM COMPLETE
HEIGHTINCHES: 62 in
Weight: 2025.6 oz

## 2016-10-23 LAB — LIPID PANEL
CHOL/HDL RATIO: 6 ratio
CHOLESTEROL: 197 mg/dL (ref 0–200)
HDL: 33 mg/dL — ABNORMAL LOW (ref >40)
LDL Cholesterol: 121 mg/dL — ABNORMAL HIGH (ref 0–99)
Triglycerides: 213 mg/dL — ABNORMAL HIGH (ref <150)
VLDL: 43 mg/dL — AB (ref 0–40)

## 2016-10-23 LAB — APTT: aPTT: 32 seconds (ref 24–36)

## 2016-10-23 LAB — GLUCOSE, CAPILLARY: Glucose-Capillary: 84 mg/dL (ref 65–99)

## 2016-10-23 LAB — MRSA PCR SCREENING: MRSA by PCR: NEGATIVE

## 2016-10-23 LAB — TROPONIN I: Troponin I: 0.04 ng/mL (ref <0.03)

## 2016-10-23 MED ORDER — CITALOPRAM HYDROBROMIDE 20 MG PO TABS
20.0000 mg | ORAL_TABLET | Freq: Every day | ORAL | Status: DC
  Administered 2016-10-23 – 2016-10-24 (×2): 20 mg via ORAL
  Filled 2016-10-23 (×2): qty 1

## 2016-10-23 MED ORDER — HYDROCODONE-ACETAMINOPHEN 5-325 MG PO TABS: 1.0000 | ORAL_TABLET | Freq: Four times a day (QID) | ORAL | Status: DC | PRN

## 2016-10-23 MED ORDER — ENOXAPARIN SODIUM 40 MG/0.4ML ~~LOC~~ SOLN
40.0000 mg | SUBCUTANEOUS | Status: DC
  Administered 2016-10-23 – 2016-10-24 (×2): 40 mg via SUBCUTANEOUS
  Filled 2016-10-23 (×2): qty 0.4

## 2016-10-23 MED ORDER — TRAZODONE HCL 50 MG PO TABS
50.0000 mg | ORAL_TABLET | Freq: Once | ORAL | Status: AC
  Administered 2016-10-23: 22:00:00 50 mg via ORAL
  Filled 2016-10-23: qty 1

## 2016-10-23 MED ORDER — CARBOXYMETHYLCELLULOSE SODIUM 0.5 % OP SOLN: 1.0000 [drp] | Freq: Three times a day (TID) | OPHTHALMIC | Status: DC | PRN

## 2016-10-23 MED ORDER — FLUTICASONE PROPIONATE 50 MCG/ACT NA SUSP
2.0000 | Freq: Every day | NASAL | Status: DC
  Administered 2016-10-23 – 2016-10-24 (×2): 2 via NASAL
  Filled 2016-10-23: qty 16

## 2016-10-23 MED ORDER — ATORVASTATIN CALCIUM 20 MG PO TABS
40.0000 mg | ORAL_TABLET | Freq: Every day | ORAL | Status: DC
  Administered 2016-10-23: 40 mg via ORAL
  Filled 2016-10-23: qty 2

## 2016-10-23 MED ORDER — ASPIRIN 325 MG PO TABS
325.0000 mg | ORAL_TABLET | Freq: Every day | ORAL | Status: DC
  Administered 2016-10-23: 325 mg via ORAL
  Filled 2016-10-23: qty 1

## 2016-10-23 MED ORDER — ACETAMINOPHEN 650 MG RE SUPP: 650.0000 mg | RECTAL | Status: DC | PRN

## 2016-10-23 MED ORDER — ASPIRIN 300 MG RE SUPP: 300.0000 mg | Freq: Every day | RECTAL | Status: DC

## 2016-10-23 MED ORDER — ASPIRIN EC 81 MG PO TBEC
81.0000 mg | DELAYED_RELEASE_TABLET | Freq: Every day | ORAL | Status: DC
  Administered 2016-10-24: 81 mg via ORAL
  Filled 2016-10-23: qty 1

## 2016-10-23 MED ORDER — ASPIRIN 81 MG PO CHEW
324.0000 mg | CHEWABLE_TABLET | Freq: Once | ORAL | Status: AC
  Administered 2016-10-23: 324 mg via ORAL
  Filled 2016-10-23: qty 4

## 2016-10-23 MED ORDER — SENNOSIDES-DOCUSATE SODIUM 8.6-50 MG PO TABS: 1.0000 | ORAL_TABLET | Freq: Every evening | ORAL | Status: DC | PRN

## 2016-10-23 MED ORDER — MIRTAZAPINE 15 MG PO TABS
15.0000 mg | ORAL_TABLET | Freq: Every day | ORAL | Status: DC
  Administered 2016-10-23: 22:00:00 15 mg via ORAL
  Filled 2016-10-23: qty 1

## 2016-10-23 MED ORDER — ACETAMINOPHEN 160 MG/5ML PO SOLN: 650.0000 mg | ORAL | Status: DC | PRN

## 2016-10-23 MED ORDER — STROKE: EARLY STAGES OF RECOVERY BOOK
Freq: Once | Status: AC
  Administered 2016-10-23: 15:00:00

## 2016-10-23 MED ORDER — PANTOPRAZOLE SODIUM 40 MG PO TBEC
40.0000 mg | DELAYED_RELEASE_TABLET | Freq: Every day | ORAL | Status: DC
  Administered 2016-10-23 – 2016-10-24 (×2): 40 mg via ORAL
  Filled 2016-10-23 (×2): qty 1

## 2016-10-23 MED ORDER — CALCIUM CARBONATE-VITAMIN D 500-200 MG-UNIT PO TABS
1.0000 | ORAL_TABLET | Freq: Two times a day (BID) | ORAL | Status: DC
  Administered 2016-10-23 – 2016-10-24 (×3): 1 via ORAL
  Filled 2016-10-23 (×3): qty 1

## 2016-10-23 MED ORDER — POLYVINYL ALCOHOL 1.4 % OP SOLN
1.0000 [drp] | Freq: Three times a day (TID) | OPHTHALMIC | Status: DC | PRN
  Filled 2016-10-23: qty 15

## 2016-10-23 MED ORDER — ACETAMINOPHEN 325 MG PO TABS: 650.0000 mg | ORAL_TABLET | ORAL | Status: DC | PRN

## 2016-10-23 MED ORDER — CLOPIDOGREL BISULFATE 75 MG PO TABS
75.0000 mg | ORAL_TABLET | Freq: Every day | ORAL | Status: DC
  Administered 2016-10-23 – 2016-10-24 (×2): 75 mg via ORAL
  Filled 2016-10-23 (×2): qty 1

## 2016-10-23 MED ORDER — SODIUM CHLORIDE 0.9 % IV SOLN
INTRAVENOUS | Status: DC
  Administered 2016-10-23 – 2016-10-24 (×2): via INTRAVENOUS

## 2016-10-23 MED ORDER — OXYBUTYNIN CHLORIDE ER 10 MG PO TB24
10.0000 mg | ORAL_TABLET | Freq: Every day | ORAL | Status: DC
  Administered 2016-10-23 – 2016-10-24 (×2): 10 mg via ORAL
  Filled 2016-10-23 (×2): qty 1

## 2016-10-23 MED ORDER — MECLIZINE HCL 25 MG PO TABS: 25.0000 mg | ORAL_TABLET | Freq: Three times a day (TID) | ORAL | Status: DC | PRN

## 2016-10-23 NOTE — H&P (Signed)
[facility] [place] [facility]   PATIENT NAME: Denise Calderon    MR#:  [id number]  DATE OF BIRTH:  [birth year]  DATE OF ADMISSION:  10/23/2016  PRIMARY CARE PHYSICIAN: Kandyce Rud, MD   REQUESTING/REFERRING PHYSICIAN:   CHIEF COMPLAINT:   Chief Complaint  Patient presents with  . Cerebrovascular Accident    HISTORY OF PRESENT ILLNESS: Denise Calderon  is a 83 y.o. female with a known history of Hypertension, pacemaker, CVA with right hemiplegia, aortic wall disease presented to the emergency room with difficulty in speech. Patient lives at assisted living facility is DO NOT RESUSCITATE by CODE STATUS. She was noted by the family members and staff to have difficulty in speech and unable to express words since yesterday evening 3 PM. Patient presented to the emergency room later tonight. Patient is awake and responds to verbal commands. According to the family member she is able to speak much better today morning. She is responding to verbal questions. No weakness or tingling and numbness in the left upper and lower extremity. No history of fall. No complaints of any dysphagia. Patient was worked up with CT head which showed old stroke. Hospitalist service was consulted for further care of the patient.  PAST MEDICAL HISTORY:   Past Medical History:  Diagnosis Date  . Anemia   . Aortic valve defect    Aortic valve replaced  . Hypertension   . Pacemaker   . Stroke (HCC)   . Vertigo     PAST SURGICAL HISTORY: Past Surgical History:  Procedure Laterality Date  . aortic valve replaced    . CAROTID STENT Left   . CTR Left   . HIP FRACTURE SURGERY Right   . PACEMAKER LEAD REMOVAL N/A 10/29/2015   Procedure: PACEMAKER generator change out;  Surgeon: Marcina Millard, MD;  Location: [facility] ORS;  Service: Cardiovascular;  Laterality: N/A;    SOCIAL HISTORY:  Social History  Substance Use Topics  . Smoking status: Former Smoker    Quit date: 10/20/1973   . Smokeless tobacco: Never Used  . Alcohol use No    FAMILY HISTORY:  Family History  Problem Relation Age of Onset  . Heart attack Mother   . Diabetes Neg Hx   . COPD Neg Hx     DRUG ALLERGIES: No Known Allergies  REVIEW OF SYSTEMS:   CONSTITUTIONAL: No fever, fatigue or weakness.  EYES: No blurred or double vision.  EARS, NOSE, AND THROAT: No tinnitus or ear pain.  RESPIRATORY: No cough, shortness of breath, wheezing or hemoptysis.  CARDIOVASCULAR: No chest pain, orthopnea, edema.  GASTROINTESTINAL: No nausea, vomiting, diarrhea or abdominal pain.  GENITOURINARY: No dysuria, hematuria.  ENDOCRINE: No polyuria, nocturia,  HEMATOLOGY: No anemia, easy bruising or bleeding SKIN: No rash or lesion. MUSCULOSKELETAL: No joint pain or arthritis.   NEUROLOGIC: No tingling, numbness.  Has difficulty in speech PSYCHIATRY: No anxiety or depression.   MEDICATIONS AT HOME:  Prior to Admission medications   Medication Sig Start Date End Date Taking? Authorizing Provider  acetaminophen (MAPAP) 500 MG tablet Take 1,000 mg by mouth 3 (three) times daily. Pt is also able to take every six hours as needed for pain.    [provider]  Calcium Carbonate-Vitamin D (CALCIUM-D) 600-400 MG-UNIT TABS Take 1 tablet by mouth 2 (two) times daily with a meal.     [provider]  carboxymethylcellulose (REFRESH PLUS) 0.5 % SOLN 1 drop 3 (three) times daily as needed.  [provider]  cephALEXin (KEFLEX) 250 MG capsule Take 1 capsule (250 mg total) by mouth 4 (four) times daily. 10/29/15   Paraschos, Alexander, MD  citalopram (CELEXA) 20 MG tablet Take 20 mg by mouth daily.    [provider]  clopidogrel (PLAVIX) 75 MG tablet Take 75 mg by mouth daily.    [provider]  diclofenac sodium (VOLTAREN) 1 % GEL Apply topically 4 (four) times daily as needed.    [provider]  fluticasone (FLONASE) 50 MCG/ACT nasal spray Place 2 sprays into both  nostrils daily.     [provider]  HYDROcodone-acetaminophen (NORCO/VICODIN) 5-325 MG tablet Take 1 tablet by mouth every 6 (six) hours as needed for moderate pain.     [provider]  meclizine (ANTIVERT) 25 MG tablet Take 25 mg by mouth 3 (three) times daily as needed for dizziness.    [provider]  mirtazapine (REMERON) 15 MG tablet Take 15 mg by mouth at bedtime.    [provider]  Multiple Vitamins-Minerals (ICAPS AREDS FORMULA PO) Take 1 tablet by mouth 2 (two) times daily.    [provider]  oxybutynin (DITROPAN-XL) 10 MG 24 hr tablet Take 10 mg by mouth daily.    [provider]  pantoprazole (PROTONIX) 40 MG tablet Take 40 mg by mouth daily.    [provider]      PHYSICAL EXAMINATION:   VITAL SIGNS: Blood pressure (!) 169/60, pulse 70, temperature 97.5 F (36.4 C), resp. rate 16, height 5\' 6"  (1.676 m), weight 58.1 kg (128 lb), SpO2 96 %.  GENERAL:  83 y.o.-year-old patient lying in the bed with no acute distress.  EYES: Pupils equal, round, reactive to light and accommodation. No scleral icterus. Extraocular muscles intact.  HEENT: Head atraumatic, normocephalic. Oropharynx and nasopharynx clear.  NECK:  Supple, no jugular venous distention. No thyroid enlargement, no tenderness.  LUNGS: Normal breath sounds bilaterally, no wheezing, rales,rhonchi or crepitation. No use of accessory muscles of respiration.  CARDIOVASCULAR: S1, S2 normal. No murmurs, rubs, or gallops.  ABDOMEN: Soft, nontender, nondistended. Bowel sounds present. No organomegaly or mass.  EXTREMITIES: No pedal edema, cyanosis, or clubbing.  NEUROLOGIC: Cranial nerves II through XII are intact. Muscle strength 5/5 left upper and left lower extremities. Right hemiplegia noted.Sensation intact. Gait not checked.  PSYCHIATRIC: The patient is alert and oriented x 2.  SKIN: No obvious rash, lesion, or ulcer.   LABORATORY PANEL:   CBC  Recent  Labs Lab 10/23/16 0248  WBC 10.0  HGB 13.4  HCT 39.0  PLT 215  MCV 96.2  MCH 33.2  MCHC 34.5  RDW 12.9   ------------------------------------------------------------------------------------------------------------------  Chemistries   Recent Labs Lab 10/23/16 0248  NA 138  K 3.7  CL 100*  CO2 29  GLUCOSE 84  BUN 12  CREATININE 0.63  CALCIUM 9.0  AST 25  ALT 12*  ALKPHOS 81  BILITOT 0.7   ------------------------------------------------------------------------------------------------------------------ estimated creatinine clearance is 42 mL/min (by C-G formula based on SCr of 0.63 mg/dL). ------------------------------------------------------------------------------------------------------------------ No results for input(s): TSH, T4TOTAL, T3FREE, THYROIDAB in the last 72 hours.  Invalid input(s): FREET3   Coagulation profile  Recent Labs Lab 10/23/16 0248  INR 1.06   ------------------------------------------------------------------------------------------------------------------- No results for input(s): DDIMER in the last 72 hours. -------------------------------------------------------------------------------------------------------------------  Cardiac Enzymes  Recent Labs Lab 10/23/16 0248  TROPONINI 0.04*   ------------------------------------------------------------------------------------------------------------------ Invalid input(s): POCBNP  ---------------------------------------------------------------------------------------------------------------  Urinalysis    Component Value Date/Time   COLORURINE YELLOW (A)  10/23/2016 0248   APPEARANCEUR HAZY (A) 10/23/2016 0248   APPEARANCEUR Hazy 08/30/2014 1836   LABSPEC 1.009 10/23/2016 0248   LABSPEC 1.010 08/30/2014 1836   PHURINE 6.0 10/23/2016 0248   GLUCOSEU NEGATIVE 10/23/2016 0248   GLUCOSEU Negative 08/30/2014 1836   HGBUR NEGATIVE 10/23/2016 0248   BILIRUBINUR NEGATIVE  10/23/2016 0248   BILIRUBINUR Negative 08/30/2014 1836   KETONESUR NEGATIVE 10/23/2016 0248   PROTEINUR NEGATIVE 10/23/2016 0248   NITRITE NEGATIVE 10/23/2016 0248   LEUKOCYTESUR LARGE (A) 10/23/2016 0248   LEUKOCYTESUR 1+ 08/30/2014 1836     RADIOLOGY: Ct Head Wo Contrast  Result Date: 10/23/2016 CLINICAL DATA:  Slurred speech.  Dysarthria. EXAM: CT HEAD WITHOUT CONTRAST TECHNIQUE: Contiguous axial images were obtained from the base of the skull through the vertex without intravenous contrast. COMPARISON:  04/30/2015 FINDINGS: Brain: Stable superficial and central atrophy. Chronic left-sided basal ganglial and thalamic infarcts with ex vacuo dilatation of the adjacent left lateral ventricle. No acute large vascular territory infarct. Small lacunar infarct along the right posterior limb of the internal capsule since prior. Moderate degree of small vessel ischemia involving the periventricular and subcortical white matter. No intra-axial mass nor extra-axial fluid. No intracranial hemorrhage. The basal cisterns and fourth ventricle are midline. Vascular: Atherosclerosis of the carotid siphons. No hyperdense vessels. Skull: No fracture or primary bone lesions. Sinuses/Orbits: Bilateral lens replacements. Intact orbits and globes. The paranasal sinuses and mastoids are nonacute. Other: None IMPRESSION: Remote left-sided basal ganglial and thalamic infarcts with small lacunar infarct along the right posterior limb of the internal capsule. No acute intracranial abnormality. Chronic moderate degree of small vessel ischemia. Cerebral atrophy. Electronically Signed   By: Tollie Eth M.D.   On: 10/23/2016 03:13    EKG: Orders placed or performed during the hospital encounter of 10/23/16  . EKG 12-Lead  . EKG 12-Lead  . ED EKG  . ED EKG  . EKG 12-Lead  . EKG 12-Lead    IMPRESSION AND PLAN: 83 year old female patient with history of CVA with right hemiplegia, hypertension, pacemaker, aortic valve  disease presented to the emergency room with difficulty in speech. Admitting diagnosis 1. Expressive aphasia 2. History of thalamic and basal ganglia CVA with right hemiplegia 3. Hypertension 4. History of pacemaker Treatment plan Admit patient to medical floor observation bed Check echocardiogram and carotid ultrasound Start patient on oral aspirin Neurology consultation Neuro check for 24 hours Patient has pacemaker which was recently checked Control blood pressure Speech therapy evaluation Supportive care  All the records are reviewed and case discussed with ED provider. Management plans discussed with the patient, family and they are in agreement.  CODE STATUS:DNR Surrogate decision maker : Daughter Code Status History    Date Active Date Inactive Code Status Order ID Comments User Context   04/30/2015  3:18 PM 05/01/2015  4:55 PM DNR [id number]  Houston Siren, MD Inpatient    Questions for Most Recent Historical Code Status (Order [id number])    Question Answer Comment   In the event of cardiac or respiratory ARREST Do not call a "code blue"    In the event of cardiac or respiratory ARREST Do not perform Intubation, CPR, defibrillation or ACLS    In the event of cardiac or respiratory ARREST Use medication by any route, position, wound care, and other measures to relive pain and suffering. May use oxygen, suction and manual treatment of airway obstruction as needed for comfort.         Advance Directive Documentation  Most Recent Value  Type of Advance Directive  Out of facility DNR (pink MOST or yellow form), Healthcare Power of Attorney  Pre-existing out of facility DNR order (yellow form or pink MOST form)  -  "MOST" Form in Place?  -       TOTAL TIME TAKING CARE OF THIS PATIENT: 50 minutes.    Ihor AustinPavan Pyreddy M.D on 10/23/2016 at 4:27 AM  Between 7am to 6pm - Pager - [phone]  After 6pm go to [email or web address] - password [email or web address] [facility]  Office  [phone]  CC: Primary care physician; Kandyce RudBabaoff, Marcus, MD

## 2016-10-23 NOTE — Progress Notes (Signed)
[facility] - [place] at [facility]   PATIENT NAME: Denise Calderon    MR#:  [id number]  DATE OF BIRTH:  Jun 02, 1923  SUBJECTIVE:  CHIEF COMPLAINT:   Chief Complaint  Patient presents with  . Cerebrovascular Accident   - Admitted with expressive aphasia and slurred speech. Symptoms have improved but still persistent. -CT of the head with no acute findings. Cannot get MRI with her pacemaker  REVIEW OF SYSTEMS:  Review of Systems  Constitutional: Positive for malaise/fatigue. Negative for chills and fever.  HENT: Negative for congestion, ear discharge, hearing loss and nosebleeds.   Eyes: Negative for blurred vision and double vision.  Respiratory: Negative for cough, shortness of breath and wheezing.   Cardiovascular: Negative for chest pain, palpitations and leg swelling.  Gastrointestinal: Negative for abdominal pain, constipation, diarrhea, nausea and vomiting.  Genitourinary: Negative for dysuria.  Musculoskeletal: Negative for myalgias.  Neurological: Positive for speech change and focal weakness. Negative for dizziness, seizures and headaches.  Psychiatric/Behavioral: Negative for depression.    DRUG ALLERGIES:  No Known Allergies  VITALS:  Blood pressure (!) 151/67, pulse 73, temperature 97.7 F (36.5 C), temperature source Oral, resp. rate 19, height 5\' 2"  (1.575 m), weight 57.4 kg (126 lb 9.6 oz), SpO2 95 %.  PHYSICAL EXAMINATION:  Physical Exam  GENERAL:  83 y.o.-year-old patient lying in the bed with no acute distress.  EYES: Pupils equal, round, reactive to light and accommodation. No scleral icterus. Extraocular muscles intact.  HEENT: Head atraumatic, normocephalic. Oropharynx and nasopharynx clear.  NECK:  Supple, no jugular venous distention. No thyroid enlargement, no tenderness.  LUNGS: Normal breath sounds bilaterally, no wheezing, rhonchi or crepitation. No use of accessory muscles of respiration. Fine bibasilar crackles  heard CARDIOVASCULAR: S1, S2 normal. No  rubs, or gallops. Loud 3/6 systolic murmur in the aortic area ABDOMEN: Soft, nontender, nondistended. Bowel sounds present. No organomegaly or mass.  EXTREMITIES: No pedal edema, cyanosis, or clubbing.  NEUROLOGIC: Minimal facial droop noted otherwise cranial nerves are intact. Speech is slurred. Muscle strength 5/5 on the left upper and lower extremities, 3/5 on the right side. Sensation intact. Gait not checked.  PSYCHIATRIC: The patient is alert and oriented x 2.  SKIN: No obvious rash, lesion, or ulcer.    LABORATORY PANEL:   CBC  Recent Labs Lab 10/23/16 0248  WBC 10.0  HGB 13.4  HCT 39.0  PLT 215   ------------------------------------------------------------------------------------------------------------------  Chemistries   Recent Labs Lab 10/23/16 0248  NA 138  K 3.7  CL 100*  CO2 29  GLUCOSE 84  BUN 12  CREATININE 0.63  CALCIUM 9.0  AST 25  ALT 12*  ALKPHOS 81  BILITOT 0.7   ------------------------------------------------------------------------------------------------------------------  Cardiac Enzymes  Recent Labs Lab 10/23/16 0248  TROPONINI 0.04*   ------------------------------------------------------------------------------------------------------------------  RADIOLOGY:  Ct Head Wo Contrast  Result Date: 10/23/2016 CLINICAL DATA:  Slurred speech.  Dysarthria. EXAM: CT HEAD WITHOUT CONTRAST TECHNIQUE: Contiguous axial images were obtained from the base of the skull through the vertex without intravenous contrast. COMPARISON:  04/30/2015 FINDINGS: Brain: Stable superficial and central atrophy. Chronic left-sided basal ganglial and thalamic infarcts with ex vacuo dilatation of the adjacent left lateral ventricle. No acute large vascular territory infarct. Small lacunar infarct along the right posterior limb of the internal capsule since prior. Moderate degree of small vessel ischemia involving the  periventricular and subcortical white matter. No intra-axial mass nor extra-axial fluid. No intracranial hemorrhage. The basal cisterns and fourth ventricle are midline. Vascular:  Atherosclerosis of the carotid siphons. No hyperdense vessels. Skull: No fracture or primary bone lesions. Sinuses/Orbits: Bilateral lens replacements. Intact orbits and globes. The paranasal sinuses and mastoids are nonacute. Other: None IMPRESSION: Remote left-sided basal ganglial and thalamic infarcts with small lacunar infarct along the right posterior limb of the internal capsule. No acute intracranial abnormality. Chronic moderate degree of small vessel ischemia. Cerebral atrophy. Electronically Signed   By: Tollie Eth M.D.   On: 10/23/2016 03:13    EKG:   Orders placed or performed during the hospital encounter of 10/23/16  . EKG 12-Lead  . EKG 12-Lead  . ED EKG  . ED EKG  . EKG 12-Lead  . EKG 12-Lead    ASSESSMENT AND PLAN:   83 year old female with past medical history significant for hypertension, sick sinus syndrome with pacemaker, prior CVA with right hemiplegia, aortic valve replacement surgery presents from assisted living facility secondary to expressive aphasia.  #1 expressive aphasia-improved but still persistent. Likely CVA/TIA -Cannot get an MRI due to her pacemaker. CT of the head on admission with no acute findings. -Patient not taking any aspirin at home, so started on aspirin and Plavix. -Carotid Dopplers and echocardiogram pending -LDL is elevated, on statin at this time. - neurology consulted. Continue neuro checks. -PT/OT/speech consults requested.  #2 CAD, history of pacemaker-recently interrogated on 10/21/2016 by her cardiologist. -Was on Plavix, we'll continue that.  #3 Depression- continue home medications. Patient on Celexa and Remeron.  #4 GERD-on Protonix  5 DVT prophylaxis-on Lovenox     All the records are reviewed and case discussed with Care Management/Social  Workerr. Management plans discussed with the patient, family and they are in agreement.  CODE STATUS: Full Code  TOTAL TIME TAKING CARE OF THIS PATIENT: 37 minutes.   POSSIBLE D/C IN 1 DAY, DEPENDING ON CLINICAL CONDITION.   Bonham Zingale M.D on 10/23/2016 at 8:30 AM  Between 7am to 6pm - Pager - [phone]  After 6pm go to [email or web address] - Social research officer, government  [facility]  Office  [phone]  CC: Primary care physician; Kandyce Rud, MD

## 2016-10-23 NOTE — Progress Notes (Signed)
SLP Cancellation Note  Patient Details Name: Renee Hardernnie W Fellman MRN: [id number] DOB: Nov 03, 1923   Cancelled treatment:       Reason Eval/Treat Not Completed: Patient at procedure or test/unavailable Received orders, reviewed chart and spoke with nsg. Pt is currently out of room for CT scan. Will re-attempt evaluation later today as pt is available.    [place],Maaran 10/23/2016, 11:41 AM

## 2016-10-23 NOTE — Care Management Obs Status (Signed)
MEDICARE OBSERVATION STATUS NOTIFICATION   Patient Details  Name: Denise Calderon MRN: [id number] Date of Birth: 22-Aug-1923   Medicare Observation Status Notification Given:  Yes ([place] Letter)    Jolee Ewingockett,Tasia Liz A, RN 10/23/2016, 2:31 PM

## 2016-10-23 NOTE — Clinical Social Work Note (Signed)
CSW received consult that patient needs possible placement. The patient resides in an ALF ([facility] currently. CSW will assess when able.  Argentina PonderKaren Martha Burch [place], MSW, Theresia MajorsLCSWA [phone]

## 2016-10-23 NOTE — Evaluation (Signed)
Speech Language Pathology Evaluation Patient Details Name: Denise Calderon MRN: [id number] DOB: 04/09/1924 Today's Date: 10/23/2016 Time: 4098-1191 SLP Time Calculation (min) (ACUTE ONLY): 45 min  Problem List:  Patient Active Problem List   Diagnosis Date Noted  . TIA (transient ischemic attack) 10/23/2016  . Carotid artery occlusion with infarction (HCC) 02/26/2016  . CVA (cerebral vascular accident) (HCC) 02/26/2016  . Essential hypertension 02/26/2016  . Hyperlipidemia 02/26/2016  . Benign paroxysmal positional vertigo 02/26/2016  . Altered mental status 04/30/2015   Past Medical History:  Past Medical History:  Diagnosis Date  . Anemia   . Aortic valve defect    Aortic valve replaced  . Hypertension   . Pacemaker   . Stroke (HCC)   . Vertigo    Past Surgical History:  Past Surgical History:  Procedure Laterality Date  . aortic valve replaced    . CAROTID STENT Left   . CTR Left   . HIP FRACTURE SURGERY Right   . PACEMAKER LEAD REMOVAL N/A 10/29/2015   Procedure: PACEMAKER generator change out;  Surgeon: Marcina Millard, MD;  Location: [facility] ORS;  Service: Cardiovascular;  Laterality: N/A;   HPI:  Denise Calderon  is a 83 y.o. female with a known history of Hypertension, pacemaker, CVA with right hemiplegia, aortic wall disease presented to the emergency room with difficulty in speech. Patient lives at assisted living facility is DO NOT RESUSCITATE by CODE STATUS. She was noted by the family members and staff to have difficulty in speech and unable to express words since yesterday evening 3 PM. Patient presented to the emergency room later tonight. Patient is awake and responds to verbal commands. According to the family member she is able to speak much better today morning. She is responding to verbal questions. No weakness or tingling and numbness in the left upper and lower extremity. No history of fall. No complaints of any dysphagia. Patient was worked up with CT head  which showed old stroke.    Assessment / Plan / Recommendation Clinical Impression  Pt presents w/moderate expressive aphasia. Per family report pt did have baseline expressive language deficits, however since yesterday pt has demonstrated worsened expressive deficits. Pt demonstrated good confrontational naming and repetition but demonstrated significant deficits with open-ended questions and conversational speech. Conversational speech was disfluent with frequent hesitations and paraphasias. Pt demonstrated mild auditory comprehension deficits with difficulty with multi-step commands. Pt and family deny any swallowing deficits. Nsg reports pt had some difficulty with a larger pill and so pt may benefit from halving pill. Will further assess as indicated. Pt would benefit from skilled ST to aid with communicating wants and needs.     SLP Assessment  SLP Recommendation/Assessment: Patient needs continued Speech Lanaguage Pathology Services SLP Visit Diagnosis: Aphasia (R47.01)    Follow Up Recommendations  Skilled Nursing facility    Frequency and Duration min 3x week  2 weeks      SLP Evaluation Cognition  Overall Cognitive Status: Difficult to assess Arousal/Alertness: Awake/alert Orientation Level: Disoriented to time;Oriented to person;Oriented to place;Oriented to situation       Comprehension  Auditory Comprehension Overall Auditory Comprehension: Impaired Yes/No Questions: Within Functional Limits Commands: Impaired One Step Basic Commands: 75-100% accurate Two Step Basic Commands: 75-100% accurate Multistep Basic Commands: 0-24% accurate Conversation: Simple EffectiveTechniques: Repetition Visual Recognition/Discrimination Discrimination: Not tested Reading Comprehension Reading Status: Not tested    Expression Expression Primary Mode of Expression: Verbal Verbal Expression Overall Verbal Expression: Impaired Initiation: No impairment Automatic Speech: Name  Level  of Generative/Spontaneous Verbalization: Sentence Repetition: Impaired Level of Impairment: Sentence level Naming: Impairment Responsive: 76-100% accurate Confrontation: Impaired Pragmatics: No impairment Interfering Components: Premorbid deficit Effective Techniques: Other (Comment) (yes/no questions) Non-Verbal Means of Communication: Not applicable Written Expression Dominant Hand: Right Written Expression: Not tested   Oral / Motor  Oral Motor/Sensory Function Overall Oral Motor/Sensory Function: Within functional limits Motor Speech Overall Motor Speech: Appears within functional limits for tasks assessed   GO          Functional Assessment Tool Used: clinical judgment Functional Limitations: Spoken language expressive Spoken Language Expression Current Status ([id number]([id number]): At least 40 percent but less than 60 percent impaired, limited or restricted Spoken Language Expression Goal Status [phone]([id number]): At least 40 percent but less than 60 percent impaired, limited or restricted         Calderon,Denise 10/23/2016, 1:38 PM

## 2016-10-23 NOTE — Clinical Social Work Note (Addendum)
Clinical Social Work Assessment  Patient Details  Name: Denise Calderon MRN: [id number] Date of Birth: Jul 14, 1923  Date of referral:  10/23/16               Reason for consult:  Facility Placement                Permission sought to share information with:  Facility Art therapist granted to share information::  Yes, Verbal Permission Granted  Name::        Agency::     Relationship::     Contact Information:     Housing/Transportation Living arrangements for the past 2 months:  [place] of Information:  Patient, Adult Children Patient Interpreter Needed:  None Criminal Activity/Legal Involvement Pertinent to Current Situation/Hospitalization:  No - Comment as needed Significant Relationships:  Adult Children, Warehouse manager, [place], Other Family Members Lives with:  Facility Resident Do you feel safe going back to the place where you live?  Yes Need for family participation in patient care:  Yes (Comment)  Care giving concerns:  PT/OT recommendation for SNF; Patient is from the [facility] assessment / plan:  CSW met with patient and her daughter at bedside to discuss discharge planning. The CSW explained the patient's observation status and the barrier with Medicare due to such. The patient and her daughter verbalized understanding, and both agreed to have the patient return to the [facility] with home health services. The patient indicated that she would have made that decision regardless of the insurance barriers.  At baseline, the patient has been presenting in a steady decline in mobility. The patient was using a RW prior to her TIA; however, she is now seemingly unable to sit without support, and she has become more dependent in functionality. The plan is for the patient to discharge to the [facility] tomorrow via EMS due to her inability to sit without support.   Employment status:  Retired Forensic scientist:  Medicare PT  Recommendations:  [place] / Referral to community resources:  Other (Comment Required) (Verbal consult for [facility] to [facility])  Patient/Family's Response to care:  The patient and her daughter thanked the CSW for assistance and update on the discharge plan.  Patient/Family's Understanding of and Emotional Response to Diagnosis, Current Treatment, and Prognosis:  The patient and her family understand the need for continued PT, OT, and ST.   Emotional Assessment Appearance:  Appears stated age Attitude/Demeanor/Rapport:  Lethargic Affect (typically observed):  Appropriate, Pleasant, Accepting Orientation:  Oriented to Self, Oriented to Place, Oriented to  Time, Oriented to Situation Alcohol / Substance use:    Psych involvement (Current and /or in the community):  No (Comment)  Discharge Needs  Concerns to be addressed:  Discharge Planning Concerns, Care Coordination Readmission within the last 30 days:  No Current discharge risk:  None Barriers to Discharge:  Continued Medical Work up   [employer], LCSW 10/23/2016, 2:19 PM

## 2016-10-23 NOTE — Consult Note (Addendum)
Referring Physician: Nemiah CommanderKalisetti    Chief Complaint: Difficulty with speech  HPI: Denise Calderon is an 83 y.o. female with a history of stroke and residual aphasia and right hemiparesis who presented with difficulty with speech. Patient lives at assisted living facility.  She was noted by the family members and staff to have difficulty in speech and unable to express words since yesterday evening 3 PM.  At baseline patient has some word finding difficulties at times but is quite understandable.  Speech is now much worse.  Patient is not as mobile either.  Initial NIHSS of 9 Daughter also reports that the patient has been having episodes of unresponsiveness.  Date last known well: Date: 10/22/2016 Time last known well: Time: 15:00 tPA Given: No: Outside time window  Past Medical History:  Diagnosis Date  . Anemia   . Aortic valve defect    Aortic valve replaced  . Hypertension   . Pacemaker   . Stroke (HCC)   . Vertigo     Past Surgical History:  Procedure Laterality Date  . aortic valve replaced    . CAROTID STENT Left   . CTR Left   . HIP FRACTURE SURGERY Right   . PACEMAKER LEAD REMOVAL N/A 10/29/2015   Procedure: PACEMAKER generator change out;  Surgeon: Marcina MillardAlexander Paraschos, MD;  Location: [facility] ORS;  Service: Cardiovascular;  Laterality: N/A;    Family History  Problem Relation Age of Onset  . Heart attack Mother   . Diabetes Neg Hx   . COPD Neg Hx    Social History:  reports that she quit smoking about 43 years ago. She has never used smokeless tobacco. She reports that she does not drink alcohol or use drugs.  Allergies: No Known Allergies  Medications:  I have reviewed the patient's current medications. Prior to Admission:  Prescriptions Prior to Admission  Medication Sig Dispense Refill Last Dose  . acetaminophen (MAPAP) 500 MG tablet Take 1,000 mg by mouth 3 (three) times daily. Pt is also able to take every six hours as needed for pain.   Taking  . Calcium  Carbonate-Vitamin D (CALCIUM-D) 600-400 MG-UNIT TABS Take 1 tablet by mouth 2 (two) times daily with a meal.    Taking  . carboxymethylcellulose (REFRESH PLUS) 0.5 % SOLN 1 drop 3 (three) times daily as needed.   Taking  . citalopram (CELEXA) 20 MG tablet Take 20 mg by mouth daily.   Taking  . clopidogrel (PLAVIX) 75 MG tablet Take 75 mg by mouth daily.   Taking  . diclofenac sodium (VOLTAREN) 1 % GEL Apply topically 4 (four) times daily as needed.   Taking  . fluticasone (FLONASE) 50 MCG/ACT nasal spray Place 2 sprays into both nostrils daily.    Taking  . HYDROcodone-acetaminophen (NORCO/VICODIN) 5-325 MG tablet Take 1 tablet by mouth every 6 (six) hours as needed for moderate pain.    Taking  . meclizine (ANTIVERT) 25 MG tablet Take 25 mg by mouth 3 (three) times daily as needed for dizziness.   Taking  . mirtazapine (REMERON) 15 MG tablet Take 15 mg by mouth at bedtime.   Taking  . Multiple Vitamins-Minerals (ICAPS AREDS FORMULA PO) Take 1 tablet by mouth 2 (two) times daily.   Taking  . oxybutynin (DITROPAN-XL) 10 MG 24 hr tablet Take 10 mg by mouth daily.   Taking  . pantoprazole (PROTONIX) 40 MG tablet Take 40 mg by mouth daily.   Taking   Scheduled: . aspirin  300 mg  Rectal Daily   Or  . aspirin  325 mg Oral Daily  . atorvastatin  40 mg Oral q1800  . calcium-vitamin D  1 tablet Oral BID WC  . citalopram  20 mg Oral Daily  . clopidogrel  75 mg Oral Daily  . enoxaparin (LOVENOX) injection  40 mg Subcutaneous Q24H  . fluticasone  2 spray Each Nare Daily  . mirtazapine  15 mg Oral QHS  . oxybutynin  10 mg Oral Daily  . pantoprazole  40 mg Oral Daily    ROS: History obtained from the patient and daughter  General ROS: negative for - chills, fatigue, fever, night sweats, weight gain or weight loss Psychological ROS: negative for - behavioral disorder, hallucinations, memory difficulties, mood swings or suicidal ideation Ophthalmic ROS: negative for - blurry vision, double vision,  eye pain or loss of vision ENT ROS: negative for - epistaxis, nasal discharge, oral lesions, sore throat, tinnitus or vertigo Allergy and Immunology ROS: negative for - hives or itchy/watery eyes Hematological and Lymphatic ROS: negative for - bleeding problems, bruising or swollen lymph nodes Endocrine ROS: negative for - galactorrhea, hair pattern changes, polydipsia/polyuria or temperature intolerance Respiratory ROS: negative for - cough, hemoptysis, shortness of breath or wheezing Cardiovascular ROS: negative for - chest pain, dyspnea on exertion, edema or irregular heartbeat Gastrointestinal ROS: negative for - abdominal pain, diarrhea, hematemesis, nausea/vomiting or stool incontinence Genito-Urinary ROS: negative for - dysuria, hematuria, incontinence or urinary frequency/urgency Musculoskeletal ROS: negative for - joint swelling or muscular weakness Neurological ROS: as noted in HPI Dermatological ROS: negative for rash and skin lesion changes  Physical Examination: Blood pressure (!) 148/76, pulse 74, temperature 98.6 F (37 C), temperature source Oral, resp. rate 18, height 5\' 2"  (1.575 m), weight 57.4 kg (126 lb 9.6 oz), SpO2 98 %.  HEENT-  Normocephalic, no lesions, without obvious abnormality.  Normal external eye and conjunctiva.  Normal TM's bilaterally.  Normal auditory canals and external ears. Normal external nose, mucus membranes and septum.  Normal pharynx. Cardiovascular- S1, S2 normal, pulses palpable throughout   Lungs- chest clear, no wheezing, rales, normal symmetric air entry Abdomen- soft, non-tender; bowel sounds normal; no masses,  no organomegaly Extremities- mild lower extremity edema Lymph-no adenopathy palpable Musculoskeletal-no joint tenderness, deformity or swelling Skin-warm and dry, no hyperpigmentation, vitiligo, or suspicious lesions  Neurological Examination   Mental Status: Alert Expressive aphasia with some dysarthria as well.  Able to follow 3  step commands with reinforcement. Cranial Nerves: II: Discs flat bilaterally; Visual fields grossly normal, pupils equal, round, reactive to light and accommodation III,IV, VI: ptosis not present, extra-ocular motions intact bilaterally V,VII: right facial droop, facial light touch sensation normal bilaterally VIII: hearing normal bilaterally IX,X: gag reflex present XI: bilateral shoulder shrug XII: midline tongue extension Motor: Right : Upper extremity   3+/5    Left:     Upper extremity   5/5  Lower extremity   3+/5     Lower extremity   5/5 Tone and bulk:increased tone on the right Sensory: Pinprick and light touch intact throughout, bilaterally Deep Tendon Reflexes: 2+ with absent left AJ and right sustained clonus Plantars: Right: upgoing   Left: upgoing Cerebellar: Normal finger-to-nose and normal heel-to-shin testing bilaterally Gait: not tested due to safety concerns   Laboratory Studies:  Basic Metabolic Panel:  Recent Labs Lab 10/23/16 0248  NA 138  K 3.7  CL 100*  CO2 29  GLUCOSE 84  BUN 12  CREATININE 0.63  CALCIUM  9.0    Liver Function Tests:  Recent Labs Lab 10/23/16 0248  AST 25  ALT 12*  ALKPHOS 81  BILITOT 0.7  PROT 7.2  ALBUMIN 3.9   No results for input(s): LIPASE, AMYLASE in the last 168 hours. No results for input(s): AMMONIA in the last 168 hours.  CBC:  Recent Labs Lab 10/23/16 0248  WBC 10.0  HGB 13.4  HCT 39.0  MCV 96.2  PLT 215    Cardiac Enzymes:  Recent Labs Lab 10/23/16 0248  TROPONINI 0.04*    BNP: Invalid input(s): POCBNP  CBG:  Recent Labs Lab 10/23/16 0248  GLUCAP 84    Microbiology: Results for orders placed or performed during the hospital encounter of 10/23/16  MRSA PCR Screening     Status: None   Collection Time: 10/23/16  7:15 AM  Result Value Ref Range Status   MRSA by PCR NEGATIVE NEGATIVE Final    Comment:        The GeneXpert MRSA Assay (FDA approved for NASAL specimens only), is  one component of a comprehensive MRSA colonization surveillance program. It is not intended to diagnose MRSA infection nor to guide or monitor treatment for MRSA infections.     Coagulation Studies:  Recent Labs  10/23/16 0248  LABPROT 13.8  INR 1.06    Urinalysis:  Recent Labs Lab 10/23/16 0248  COLORURINE YELLOW*  LABSPEC 1.009  PHURINE 6.0  GLUCOSEU NEGATIVE  HGBUR NEGATIVE  BILIRUBINUR NEGATIVE  KETONESUR NEGATIVE  PROTEINUR NEGATIVE  NITRITE NEGATIVE  LEUKOCYTESUR LARGE*    Lipid Panel:    Component Value Date/Time   CHOL 197 10/23/2016 0248   CHOL 87 01/19/2014 0543   TRIG 213 (H) 10/23/2016 0248   TRIG 84 01/19/2014 0543   HDL 33 (L) 10/23/2016 0248   HDL 39 (L) 01/19/2014 0543   CHOLHDL 6.0 10/23/2016 0248   VLDL 43 (H) 10/23/2016 0248   VLDL 17 01/19/2014 0543   LDLCALC 121 (H) 10/23/2016 0248   LDLCALC 31 01/19/2014 0543    HgbA1C:  Lab Results  Component Value Date   HGBA1C 5.5 10/30/2013    Urine Drug Screen:  No results found for: LABOPIA, COCAINSCRNUR, LABBENZ, AMPHETMU, THCU, LABBARB  Alcohol Level: No results for input(s): ETH in the last 168 hours.  Other results: EKG: paced rhythm at 74 bpm  Imaging: Ct Head Wo Contrast  Result Date: 10/23/2016 CLINICAL DATA:  Slurred speech.  Dysarthria. EXAM: CT HEAD WITHOUT CONTRAST TECHNIQUE: Contiguous axial images were obtained from the base of the skull through the vertex without intravenous contrast. COMPARISON:  04/30/2015 FINDINGS: Brain: Stable superficial and central atrophy. Chronic left-sided basal ganglial and thalamic infarcts with ex vacuo dilatation of the adjacent left lateral ventricle. No acute large vascular territory infarct. Small lacunar infarct along the right posterior limb of the internal capsule since prior. Moderate degree of small vessel ischemia involving the periventricular and subcortical white matter. No intra-axial mass nor extra-axial fluid. No intracranial  hemorrhage. The basal cisterns and fourth ventricle are midline. Vascular: Atherosclerosis of the carotid siphons. No hyperdense vessels. Skull: No fracture or primary bone lesions. Sinuses/Orbits: Bilateral lens replacements. Intact orbits and globes. The paranasal sinuses and mastoids are nonacute. Other: None IMPRESSION: Remote left-sided basal ganglial and thalamic infarcts with small lacunar infarct along the right posterior limb of the internal capsule. No acute intracranial abnormality. Chronic moderate degree of small vessel ischemia. Cerebral atrophy. Electronically Signed   By: Tollie Eth M.D.   On: 10/23/2016 03:13  US Carotid Bilateral (at [facility] And [facility] Only)  Result Date: 10/23/2016 CLINICAL DATA:  Dysphagia. History of hypertension, stroke, right-sided carotid stent, aortic valve replacement and pacemaker. Former smoker. EXAM: BILATERAL CAROTID DUPLEX ULTRASOUND TECHNIQUE: Wallace Cullens scale imaging, color Doppler and duplex ultrasound were performed of bilateral carotid and vertebral arteries in the neck. COMPARISON:  None. FINDINGS: Criteria: Quantification of carotid stenosis is based on velocity parameters that correlate the residual internal carotid diameter with NASCET-based stenosis levels, using the diameter of the distal internal carotid lumen as the denominator for stenosis measurement. The following velocity measurements were obtained: RIGHT ICA:  84/9 cm/sec CCA:  67/12 cm/sec SYSTOLIC ICA/CCA RATIO:  1.3 DIASTOLIC ICA/CCA RATIO:  0.8 ECA:  74 cm/sec LEFT ICA:  78/13 cm/sec CCA:  80/7 cm/sec SYSTOLIC ICA/CCA RATIO:  1.0 DIASTOLIC ICA/CCA RATIO:  2.0 ECA:  184 cm/sec RIGHT CAROTID ARTERY: Carotid stent appears widely patent without evidence of intimal thickening or recurrent atherosclerotic plaque. There are no elevated peak systolic velocities within the interrogated course the right internal carotid artery to suggest a hemodynamically significant stenosis. RIGHT VERTEBRAL ARTERY:  Antegrade  Flow LEFT CAROTID ARTERY: There is a moderate amount of eccentric mixed echogenic partially shadowing plaque involving the origin and proximal aspects of the left internal carotid artery (image 54), not resulting in elevated peak systolic velocities within the interrogated course the left internal carotid artery to suggest a hemodynamically significant stenosis. LEFT VERTEBRAL ARTERY:  Antegrade flow IMPRESSION: 1. Post right carotid stent placement without evidence of recurrent or residual hemodynamically significant stenosis. 2. Moderate amount of left-sided atherosclerotic plaque, not resulting in a hemodynamically significant stenosis. Electronically Signed   By: Simonne Come M.D.   On: 10/23/2016 11:56    Assessment: 83 y.o. female with a history of stroke with residual right hemiparesis who presented with aphasia.  Likely secondary to small vessel disease.  Head CT reviewed and shows no acute changes.  MRI unable to be performed.  Suspect new acute left hemispheric infarct.  Small vessel disease likely etiology.  Carotid dopplers show no evidence of hemodynamically significant stenosis with intact stent.  Echocardiogram shows no cardiac source of emboli with an EF of 55-65%.  LDL 121.  Stroke Risk Factors - hypertension  Plan: 1. HgbA1c 2. PT consult, OT consult, Speech consult 3. Prophylactic therapy-Continue Plavix and change ASA to 81mg  daily 4. NPO until RN stroke swallow screen 5. Telemetry monitoring 6. Frequent neuro checks 7. EEG.  May be performed as an outpatient  8. Statin for lipid management with target LDL<70.   Case discussed with Dr. Donaciano Eva, MD Neurology [phone] 10/23/2016, 7:44 PM

## 2016-10-23 NOTE — ED Provider Notes (Signed)
[facility] Emergency Department Provider Note   ____________________________________________   First MD Initiated Contact with Patient 10/23/16 0245     (approximate)  I have reviewed the triage vital signs and the nursing notes.   HISTORY  Chief Complaint Cerebrovascular Accident  EM caveat: Patient experiencing some possible dysarthria or aphasia making history gathering somewhat more difficult  HPI Denise Calderon is a 83 y.o. female who according to staff as well as speaking to the patient's daughter, Denise Calderon, began having trouble with speech and seemed slightly confused or having difficulty with words starting around 3 PM yesterday when one of the nurses noticed it. The exact time of onset is unclear, but it is clear that the symptoms began yesterday at 3 PM or at least without temp first noticed  The patient has chronic weakness in the right arm and right leg. They've not noticed any new weakness except possibly there was a little bit of drooping in her face also, but that is somewhat unclear.  Patient denies any pain, but does report she feels like she is having some trouble connecting things. No recent illnesses fevers or chills. She takes Plavix daily and has a history of a previous stroke   Past Medical History:  Diagnosis Date  . Anemia   . Aortic valve defect    Aortic valve replaced  . Hypertension   . Pacemaker   . Stroke (HCC)   . Vertigo     Patient Active Problem List   Diagnosis Date Noted  . Carotid artery occlusion with infarction (HCC) 02/26/2016  . CVA (cerebral vascular accident) (HCC) 02/26/2016  . Essential hypertension 02/26/2016  . Hyperlipidemia 02/26/2016  . Benign paroxysmal positional vertigo 02/26/2016  . Altered mental status 04/30/2015    Past Surgical History:  Procedure Laterality Date  . aortic valve replaced    . CAROTID STENT Left   . CTR Left   . HIP FRACTURE SURGERY Right   . PACEMAKER LEAD  REMOVAL N/A 10/29/2015   Procedure: PACEMAKER generator change out;  Surgeon: Marcina Millard, MD;  Location: [facility] ORS;  Service: Cardiovascular;  Laterality: N/A;    Prior to Admission medications   Medication Sig Start Date End Date Taking? Authorizing Provider  acetaminophen (MAPAP) 500 MG tablet Take 1,000 mg by mouth 3 (three) times daily. Pt is also able to take every six hours as needed for pain.    [provider]  Calcium Carbonate-Vitamin D (CALCIUM-D) 600-400 MG-UNIT TABS Take 1 tablet by mouth 2 (two) times daily with a meal.     [provider]  carboxymethylcellulose (REFRESH PLUS) 0.5 % SOLN 1 drop 3 (three) times daily as needed.    [provider]  cephALEXin (KEFLEX) 250 MG capsule Take 1 capsule (250 mg total) by mouth 4 (four) times daily. 10/29/15   Paraschos, Alexander, MD  citalopram (CELEXA) 20 MG tablet Take 20 mg by mouth daily.    [provider]  clopidogrel (PLAVIX) 75 MG tablet Take 75 mg by mouth daily.    [provider]  diclofenac sodium (VOLTAREN) 1 % GEL Apply topically 4 (four) times daily as needed.    [provider]  fluticasone (FLONASE) 50 MCG/ACT nasal spray Place 2 sprays into both nostrils daily.     [provider]  HYDROcodone-acetaminophen (NORCO/VICODIN) 5-325 MG tablet Take 1 tablet by mouth every 6 (six) hours as needed for moderate pain.     [provider]  meclizine (ANTIVERT) 25  MG tablet Take 25 mg by mouth 3 (three) times daily as needed for dizziness.    [provider]  mirtazapine (REMERON) 15 MG tablet Take 15 mg by mouth at bedtime.    [provider]  Multiple Vitamins-Minerals (ICAPS AREDS FORMULA PO) Take 1 tablet by mouth 2 (two) times daily.    [provider]  oxybutynin (DITROPAN-XL) 10 MG 24 hr tablet Take 10 mg by mouth daily.    [provider]  pantoprazole (PROTONIX) 40 MG tablet Take 40 mg by mouth daily.     [provider]    Allergies Patient has no known allergies.  Family History  Problem Relation Age of Onset  . Heart attack Mother     Social History Social History  Substance Use Topics  . Smoking status: Former Smoker    Quit date: 10/20/1973  . Smokeless tobacco: Never Used  . Alcohol use No    Review of Systems Constitutional: No fever/chills Eyes: No visual changes. ENT: No sore throat. Cardiovascular: Denies chest pain. Respiratory: Denies shortness of breath. Gastrointestinal: No abdominal pain.  No nausea, no vomiting.  No diarrhea.  No constipation. Genitourinary: Negative for dysuria. Musculoskeletal: Negative for back pain. Skin: Negative for rash. Neurological: See history of present illness. Denies any new weakness    ____________________________________________   PHYSICAL EXAM:  VITAL SIGNS: ED Triage Vitals  Enc Vitals Group     BP 10/23/16 0244 (!) 191/89     Pulse Rate 10/23/16 0244 90     Resp 10/23/16 0244 (!) 21     Temp 10/23/16 0244 97.5 F (36.4 C)     Temp src --      SpO2 10/23/16 0244 95 %     Weight 10/23/16 0245 128 lb (58.1 kg)     Height 10/23/16 0245 5\' 6"  (1.676 m)     Head Circumference --      Peak Flow --      Pain Score 10/23/16 0244 0     Pain Loc --      Pain Edu? --      Excl. in GC? --     Constitutional: Alert and oriented. Well appearing and in no acute distress. Eyes: Conjunctivae are normal. Head: Atraumatic. Nose: No congestion/rhinnorhea. Mouth/Throat: Mucous membranes are moist. Neck: No stridor.   Cardiovascular: Normal rate, regular rhythm. Grossly normal heart sounds.  Good peripheral circulation. Respiratory: Normal respiratory effort.  No retractions. Lungs CTAB. Gastrointestinal: Soft and nontender. No distention. Musculoskeletal: No lower extremity tenderness nor edema. Neurologic:    NIH score equals 7, performed by me at bedside (however, and this includes 4 points for previous  right-sided deficits). The patient has no weakness against gravity on the right, no pronator drift on left The patient has normal cranial nerve exam except for possibly some slight left facial weakness though this is hard to judge, not obvious. Extraocular movements are normal. Visual fields are normal. Patient has 5 out of 5 strength in all extremities on the left, but about one to 2 on both right-sided extremities which is reported as chronic. There is no numbness or gross, acute sensory abnormality in the extremities bilaterally though the patient has some difficulty expressing. There is speech disturbance, dysarthria. Word finding difficulty Normal finger nose finger on the left, unable on the right due to previous weakness Patient speaking but gets slightly frustrated, seems to have difficulty forming words.   Skin:  Skin is warm, dry and intact. No rash  noted. Psychiatric: Mood and affect are normal. Speech and behavior are normal.  ____________________________________________   LABS (all labs ordered are listed, but only abnormal results are displayed)  Labs Reviewed  COMPREHENSIVE METABOLIC PANEL - Abnormal; Notable for the following:       Result Value   Chloride 100 (*)    ALT 12 (*)    All other components within normal limits  TROPONIN I - Abnormal; Notable for the following:    Troponin I 0.04 (*)    All other components within normal limits  PROTIME-INR  APTT  CBC  GLUCOSE, CAPILLARY  URINALYSIS, ROUTINE W REFLEX MICROSCOPIC  CBG MONITORING, ED   ____________________________________________  EKG  Reviewed and interpreted by me at 2:45 AM Heart rate 75 QRS 180 QTc 520 Atrial with ventricular pacing, dual pacing. No obvious ischemia noted, of note the patient denies any chest pain, and given no associated chest symptoms or pain I do not believe the ST elevation noted in the inferior leads is of obvious clinical significance. Likely due to pacing. Troponin being  sent. ____________________________________________  RADIOLOGY  Ct Head Wo Contrast  Result Date: 10/23/2016 CLINICAL DATA:  Slurred speech.  Dysarthria. EXAM: CT HEAD WITHOUT CONTRAST TECHNIQUE: Contiguous axial images were obtained from the base of the skull through the vertex without intravenous contrast. COMPARISON:  04/30/2015 FINDINGS: Brain: Stable superficial and central atrophy. Chronic left-sided basal ganglial and thalamic infarcts with ex vacuo dilatation of the adjacent left lateral ventricle. No acute large vascular territory infarct. Small lacunar infarct along the right posterior limb of the internal capsule since prior. Moderate degree of small vessel ischemia involving the periventricular and subcortical white matter. No intra-axial mass nor extra-axial fluid. No intracranial hemorrhage. The basal cisterns and fourth ventricle are midline. Vascular: Atherosclerosis of the carotid siphons. No hyperdense vessels. Skull: No fracture or primary bone lesions. Sinuses/Orbits: Bilateral lens replacements. Intact orbits and globes. The paranasal sinuses and mastoids are nonacute. Other: None IMPRESSION: Remote left-sided basal ganglial and thalamic infarcts with small lacunar infarct along the right posterior limb of the internal capsule. No acute intracranial abnormality. Chronic moderate degree of small vessel ischemia. Cerebral atrophy. Electronically Signed   By: Tollie Eth M.D.   On: 10/23/2016 03:13    ____________________________________________  PROCEDURES  Procedure(s) performed: None  Procedures  Critical Care performed: Yes, see critical care note(s)  CRITICAL CARE Performed by: Sharyn Creamer  Total critical care time: 39 minutes  Critical care time was exclusive of separately billable procedures and treating other patients.  Critical care was necessary to treat or prevent imminent or life-threatening deterioration.  Critical care was time spent personally by me on the  following activities: development of treatment plan with patient and/or surrogate as well as nursing, discussions with consultants, evaluation of patient's response to treatment, examination of patient, obtaining history from patient or surrogate, ordering and performing treatments and interventions, ordering and review of laboratory studies, ordering and review of radiographic studies, pulse oximetry and re-evaluation of patient's condition.  Patient presents for evaluation with concerns for acute onset of dysarthria starting sometime before or around 3 PM yesterday. The patient does exhibit concerns for a new neurologic deficit including speech disturbances and possibly a slight facial droop. Based upon this, patient was ordered for an acute head CT for concerns of possible exclusion of stroke. Upon my assessment, I do not see any evidence of large vessel occlusion which would be new, in addition the patient is outside of the TPA window  with symptoms occurring greater than 4-1/2 hours ago, and does not have clinical evidence suggesting obvious LVO. Other etiologies such as in infectious, metabolic, cardiac, etc. all considered. ____________________________________________   INITIAL IMPRESSION / ASSESSMENT AND PLAN / ED COURSE  Pertinent labs & imaging results that were available during my care of the patient were reviewed by me and considered in my medical decision making (see chart for details).  ----------------------------------------- 3:55 AM on 10/23/2016 -----------------------------------------  Stable exam. Patient with no complaint. Family at bedside. Discussed with Dr. Amada JupiterKirkpatrick, we will admit patient for workup for concerns of a possible new stroke. Evidence by some expressive aphasia. Past bedside swallow. We'll administer aspirin by mouth as recommended by neurology.  Troponin minimally elevated. No acute cardiac symptoms reported by patient. EKG paced.  Discussed case with Dr.  Lauro FranklinPireddy of the hospitalist service      ____________________________________________   FINAL CLINICAL IMPRESSION(S) / ED DIAGNOSES  Final diagnoses:  Expressive aphasia      NEW MEDICATIONS STARTED DURING THIS VISIT:  New Prescriptions   No medications on file     Note:  This document was prepared using Dragon voice recognition software and may include unintentional dictation errors.     Sharyn CreamerQuale, Aniko Finnigan, MD 10/23/16 [phone]

## 2016-10-23 NOTE — Evaluation (Signed)
Occupational Therapy Evaluation Patient Details Name: Denise Calderon MRN: [id number] DOB: May 28, 1923 Today's Date: 10/23/2016    History of Present Illness 83 yo female with onset of TIA and remote stroke changes was admitted.  Has PMHx:  pacemaker, SSS, aortic valve replacement, CAD   Clinical Impression   Pt seen for OT evaluation. Pt with previous CVA with residual R sided weakness, and unable to functionally use RUE. Pt living at ALF with increasing need for assist from staff for functional mobility and self care tasks. Pt presents with mild expressive aphasia, and globally weak with RUE stiffness/decreased ROM, which compounded with residual R sided deficits from previous CVA, leading to increased need for assistance for functional mobility and self care tasks. Would benefit from skilled OT services to address noted impairments and functional deficits in self care tasks in order to maximize return to PLOF and minimize risk of falls and increased caregiver burden. Recommend STR following hospitalization to address wheelchair mobility, functional transfers, RUE contracture prevention, and self care skills.    Follow Up Recommendations  SNF    Equipment Recommendations  Other (comment) (defer to next venue of care)    Recommendations for Other Services       Precautions / Restrictions Precautions Precautions: Fall Precaution Comments: telemetry Restrictions Weight Bearing Restrictions: No      Mobility Bed Mobility Overal bed mobility: Needs Assistance             General bed mobility comments: deferred, SLP in at end of session for evaluation, please see PT evaluation  Transfers Overall transfer level: Needs assistance               General transfer comment: deferred, SLP in at end of session for evaluation, please see PT evaluation    Balance Overall balance assessment: Needs assistance Sitting balance - Comments: deferred, SLP in at end of session for  evaluation, please see PT evaluation                               ADL either performed or assessed with clinical judgement   ADL Overall ADL's : Needs assistance/impaired Eating/Feeding: Set up;Bed level Eating/Feeding Details (indicate cue type and reason): HOB elevated, min assist to open packets/set up but then pt able to use regular utensils fairly well with minimal spillage (uses weighted utensils and divided plate at baseline) Grooming: Bed level;Set up   Upper Body Bathing: Moderate assistance;Bed level   Lower Body Bathing: Bed level;Maximal assistance   Upper Body Dressing : Moderate assistance;Bed level   Lower Body Dressing: Maximal assistance;Bed level                 General ADL Comments: pt generally mod-max assist for ADL     Vision Baseline Vision/History: Wears glasses Wears Glasses: At all times (does not have them with her at the hospital) Patient Visual Report: No change from baseline Vision Assessment?: No apparent visual deficits     Perception     Praxis      Pertinent Vitals/Pain Pain Assessment: No/denies pain     Hand Dominance Right (has been using L hand since old CVA (R hemiplegia))   Extremity/Trunk Assessment Upper Extremity Assessment Upper Extremity Assessment: Generalized weakness;RUE deficits/detail (LUE grossly 4-/5 weak) RUE Deficits / Details: R hemi from old CVA with inability to functionally use the arm RUE Coordination: decreased fine motor;decreased gross motor   Lower Extremity Assessment Lower Extremity  Assessment: Defer to PT evaluation RLE Deficits / Details: weakness to stand on RLE, poor control to support on it RLE Coordination: decreased gross motor;decreased fine motor   Cervical / Trunk Assessment Cervical / Trunk Assessment: Kyphotic   Communication Communication Communication: HOH   Cognition Arousal/Alertness: Awake/alert Behavior During Therapy: WFL for tasks assessed/performed Overall  Cognitive Status: Within Functional Limits for tasks assessed                                 General Comments: some mild expressive aphasia, daughter reports this is worse than baseline (does have "spells" of expressive aphasia/word finding difficulties since old CVA)   General Comments  external catheter in place    Exercises Other Exercises Other Exercises: pt/daughter educated in AE to assist with self feeding (weighted utensils versus built up handles, dycem mat so plate doesn't move) to improve independence   Shoulder Instructions      Home Living Family/patient expects to be discharged to:: Skilled nursing facility                                        Prior Functioning/Environment Level of Independence: Needs assistance  Gait / Transfers Assistance Needed: [facility] and up until approx 6 mo ago was able to perform wc transfers and wc mobility independently, but more recently has required assistance for all wc mobility and functional transfers ADL's / Homemaking Assistance Needed: staff at ALF assist with cooking/cleaning, more recently also assisting with bed mobility, dressing, bathing; dtr reports pt uses weighted/built up utensils and a divided plate for meals to improve independence with self feeding            OT Problem List: Decreased strength;Decreased range of motion;Decreased cognition;Decreased activity tolerance;Decreased knowledge of use of DME or AE;Impaired UE functional use      OT Treatment/Interventions: Self-care/ADL training;Therapeutic exercise;Therapeutic activities;Neuromuscular education;Energy conservation;DME and/or AE instruction;Patient/family education    OT Goals(Current goals can be found in the care plan section) Acute Rehab OT Goals Patient Stated Goal: to get stronger OT Goal Formulation: With patient/family Time For Goal Achievement: 11/06/16 Potential to Achieve Goals: Good  OT Frequency: Min  2X/week   Barriers to D/C:            Co-evaluation              AM-PAC PT "6 Clicks" Daily Activity     Outcome Measure Help from another person eating meals?: A Little Help from another person taking care of personal grooming?: A Little Help from another person toileting, which includes using toliet, bedpan, or urinal?: A Lot Help from another person bathing (including washing, rinsing, drying)?: A Lot Help from another person to put on and taking off regular upper body clothing?: A Lot Help from another person to put on and taking off regular lower body clothing?: A Lot 6 Click Score: 14   End of Session    Activity Tolerance: Patient tolerated treatment well Patient left: in bed;with call bell/phone within reach;with bed alarm set;with family/visitor present;Other (comment) (with SLP in room for evaluation)  OT Visit Diagnosis: Other abnormalities of gait and mobility (R26.89);Feeding difficulties (R63.3);Muscle weakness (generalized) (M62.81);Cognitive communication deficit (R41.841) Symptoms and signs involving cognitive functions: Cerebral infarction  Time: 1206-1232 OT Time Calculation (min): 26 min Charges:  OT General Charges $OT Visit: 1 Procedure OT Evaluation $OT Eval Moderate Complexity: 1 Procedure OT Treatments $Self Care/Home Management : 8-22 mins G-Codes: OT G-codes **NOT FOR INPATIENT CLASS** Functional Assessment Tool Used: AM-PAC 6 Clicks Daily Activity;Clinical judgement Functional Limitation: Self care Self Care Current Status (Z3086): At least 40 percent but less than 60 percent impaired, limited or restricted Self Care Goal Status (V7846): At least 40 percent but less than 60 percent impaired, limited or restricted   Richrd Prime, MPH, MS, OTR/L ascom [phone] 10/23/16, 1:25 PM

## 2016-10-23 NOTE — ED Triage Notes (Signed)
Pt arrives via [facility] with c/o possible stroke. Pt was LKW at 1500 yesterday afternoon but facility, [facility], states that she has been having difficulty speaking since that time. Pt is alert and oriented x3 and able to answer questions. Pt does have slurred speech and preexisting right sided weakness from previous CVA. Per EMS, BP 150/88, PR 98, O2 96% RA, and CBG 80. Pt denies SOB, swallowing difficulty or any pain.

## 2016-10-23 NOTE — Evaluation (Signed)
Physical Therapy Evaluation Patient Details Name: Denise Calderon MRN: [id number] DOB: 09/16/1923 Today's Date: 10/23/2016   History of Present Illness  83 yo female with onset of TIA and remote stroke changes was admitted.  Has PMHx:  pacemaker, SSS, aortic valve replacemetn, CAD  Clinical Impression  Pt was seen for evaluation of mobility after her admission from ALF, and noted per pt that she was using RW vs wc prior to her trip here.  Her functional level is much more dependent, has been unable to tolerate sitting without support, and needs help to get initiation of any movement to change posture.  Her daughter came in after eval and talked with her at length about her decline, expectations of need to increase her function to succeed at ALF.  Will follow acutely to increase ability to balance sitting and standing, to increase independence of transfers and to promote more tolerance OOB and for progression to gait.    Follow Up Recommendations SNF    Equipment Recommendations  None recommended by PT    Recommendations for Other Services       Precautions / Restrictions Precautions Precautions: Fall (telemetry) Restrictions Weight Bearing Restrictions: No      Mobility  Bed Mobility Overal bed mobility: Needs Assistance Bed Mobility: Supine to Sit     Supine to sit: Mod assist     General bed mobility comments: used bed pad to assist to side of bed, trunk support to control sitting posture  Transfers Overall transfer level: Needs assistance Equipment used: 1 person hand held assist Transfers: Stand Pivot Transfers   Stand pivot transfers: Max assist       General transfer comment: pivoted with drop arm chair to sit up on alarm pad, support under RUE  Ambulation/Gait             General Gait Details: non ambulatory  Stairs            Wheelchair Mobility    Modified Rankin (Stroke Patients Only) Modified Rankin (Stroke Patients Only) Pre-Morbid  Rankin Score: Moderately severe disability Modified Rankin: Severe disability     Balance Overall balance assessment: Needs assistance Sitting-balance support: Feet supported (trunk support) Sitting balance-Leahy Scale: Poor     Standing balance support: Bilateral upper extremity supported Standing balance-Leahy Scale: Poor                               Pertinent Vitals/Pain Pain Assessment: No/denies pain    Home Living Family/patient expects to be discharged to:: Skilled nursing facility                      Prior Function Level of Independence: Needs assistance   Gait / Transfers Assistance Needed: was in [facility] or walker per pt but very debilitated now  ADL's / Homemaking Assistance Needed: staff of ALF to assist cooking and cleaning and her care        Hand Dominance        Extremity/Trunk Assessment   Upper Extremity Assessment Upper Extremity Assessment: RUE deficits/detail RUE Deficits / Details: R hemi change with inability to functionally use the arm RUE Coordination: decreased gross motor;decreased fine motor    Lower Extremity Assessment Lower Extremity Assessment: Generalized weakness;RLE deficits/detail RLE Deficits / Details: weakness to stand on RLE, poor control to support on it RLE Coordination: decreased gross motor;decreased fine motor    Cervical / Trunk Assessment Cervical /  Trunk Assessment: Kyphotic  Communication   Communication: HOH  Cognition Arousal/Alertness: Awake/alert Behavior During Therapy: WFL for tasks assessed/performed Overall Cognitive Status: Within Functional Limits for tasks assessed                                        General Comments      Exercises     Assessment/Plan    PT Assessment Patient needs continued PT services  PT Problem List Decreased strength;Decreased range of motion;Decreased activity tolerance;Decreased balance;Decreased mobility;Decreased  coordination;Decreased knowledge of use of DME;Decreased safety awareness;Cardiopulmonary status limiting activity       PT Treatment Interventions DME instruction;Gait training;Functional mobility training;Therapeutic activities;Therapeutic exercise;Balance training;Neuromuscular re-education;Patient/family education    PT Goals (Current goals can be found in the Care Plan section)  Acute Rehab PT Goals Patient Stated Goal: to get stronger PT Goal Formulation: With patient Time For Goal Achievement: 11/06/16 Potential to Achieve Goals: Fair    Frequency Min 2X/week   Barriers to discharge Decreased caregiver support (has been in ALF and may not be able to assist her)      Co-evaluation               AM-PAC PT "6 Clicks" Daily Activity  Outcome Measure Difficulty turning over in bed (including adjusting bedclothes, sheets and blankets)?: Total Difficulty moving from lying on back to sitting on the side of the bed? : Total Difficulty sitting down on and standing up from a chair with arms (e.g., wheelchair, bedside commode, etc,.)?: Total Help needed moving to and from a bed to chair (including a wheelchair)?: A Lot Help needed walking in hospital room?: Total Help needed climbing 3-5 steps with a railing? : Total 6 Click Score: 7    End of Session Equipment Utilized During Treatment: Gait belt Activity Tolerance: Other (comment);Patient limited by fatigue (limited by R hemiparesis) Patient left: in chair;with call bell/phone within reach;with chair alarm set Nurse Communication: Mobility status;Other (comment) (asked about peri suction device) PT Visit Diagnosis: Muscle weakness (generalized) (M62.81);Hemiplegia and hemiparesis;Unsteadiness on feet (R26.81) Hemiplegia - Right/Left: Right Hemiplegia - dominant/non-dominant: Dominant Hemiplegia - caused by: Cerebral infarction    Time: 1610-9604: 0936-0956 PT Time Calculation (min) (ACUTE ONLY): 20 min   Charges:   PT  Evaluation $PT Eval Moderate Complexity: 1 Procedure     PT G Codes:   PT G-Codes **NOT FOR INPATIENT CLASS** Functional Assessment Tool Used: AM-PAC 6 Clicks Basic Mobility;Clinical judgement Functional Limitation: Mobility: Walking and moving around Mobility: Walking and Moving Around Current Status (V4098(G8978): At least 80 percent but less than 100 percent impaired, limited or restricted Mobility: Walking and Moving Around Goal Status [phone](G8979): At least 20 percent but less than 40 percent impaired, limited or restricted    Denise Calderon 10/23/2016, 12:23 PM   Denise Calderon, PT MS Acute Rehab Dept. Number: [facility] [id number] and [facility] [phone]

## 2016-10-24 ENCOUNTER — Observation Stay: Payer: Medicare Other

## 2016-10-24 DIAGNOSIS — G459 Transient cerebral ischemic attack, unspecified: Secondary | ICD-10-CM | POA: Diagnosis not present

## 2016-10-24 LAB — URINE CULTURE: SPECIAL REQUESTS: NORMAL

## 2016-10-24 MED ORDER — ATORVASTATIN CALCIUM 40 MG PO TABS: 40.0000 mg | ORAL_TABLET | Freq: Every day | ORAL | 1 refills | Status: DC

## 2016-10-24 MED ORDER — AMLODIPINE BESYLATE 5 MG PO TABS
5.0000 mg | ORAL_TABLET | Freq: Every day | ORAL | Status: DC
  Administered 2016-10-24: 5 mg via ORAL
  Filled 2016-10-24: qty 1

## 2016-10-24 MED ORDER — AMLODIPINE BESYLATE 5 MG PO TABS: 5.0000 mg | ORAL_TABLET | Freq: Every day | ORAL | 1 refills | Status: DC

## 2016-10-24 MED ORDER — ASPIRIN 81 MG PO TBEC: 81.0000 mg | DELAYED_RELEASE_TABLET | Freq: Every day | ORAL | 1 refills | Status: DC

## 2016-10-24 NOTE — Discharge Summary (Signed)
[employer] [facility] [place] [facility]   PATIENT NAME: Denise Calderon    MR#:  [id number]  DATE OF BIRTH:  [birth year]  DATE OF ADMISSION:  10/23/2016   ADMITTING PHYSICIAN: Ihor Austin, MD  DATE OF DISCHARGE:  10/24/2016  PRIMARY CARE PHYSICIAN: Kandyce Rud, MD   ADMISSION DIAGNOSIS:   Expressive aphasia [R47.01]  DISCHARGE DIAGNOSIS:   Active Problems:   TIA (transient ischemic attack)   SECONDARY DIAGNOSIS:   Past Medical History:  Diagnosis Date  . Anemia   . Aortic valve defect    Aortic valve replaced  . Hypertension   . Pacemaker   . Stroke (HCC)   . Vertigo     HOSPITAL COURSE:   83 year old female with past medical history significant for hypertension, sick sinus syndrome with pacemaker, prior CVA with right hemiplegia, aortic valve replacement surgery presents from assisted living facility secondary to expressive aphasia.  #1 expressive aphasia and slurred speech-likely TIA versus an acute left hemispheric infarct -Cannot get an MRI due to her pacemaker. CT of the head on admission with no acute findings. -Repeat CT of the head showing remote infarcts and no new findings at this time. -Carotid Dopplers with history of right carotid endarterectomy which is patent, left-sided moderate stenosis but no hemodynamically significant stenosis and echocardiogram showing normal EF and no cardiac source of emboli at this time. -LDL is elevated, started on statin. -  her speech is much better and close to baseline at this time. Physical therapy recommended rehabilitation, however family comfortable taking her back to the assisted living facility with home health. -Daughter describes a questionable seizure-like episode last week. Neurology recommended outpatient EEG.  #2 CAD, history of pacemaker-recently interrogated on 10/21/2016 by her cardiologist. -Was on Plavix, we'll continue that.  #3 Depression- continue home medications. Patient on  Celexa and Remeron.  #4 GERD-on Protonix  #5 hypertension-started on Norvasc as blood pressure was elevated in the hospital  Will be discharged today   DISCHARGE CONDITIONS:   Guarded CONSULTS OBTAINED:   Treatment Team:  Thana Farr, MD  DRUG ALLERGIES:   No Known Allergies DISCHARGE MEDICATIONS:   Allergies as of 10/24/2016   No Known Allergies     Medication List    TAKE these medications   amLODipine 5 MG tablet Commonly known as:  NORVASC Take 1 tablet (5 mg total) by mouth daily.   aspirin 81 MG EC tablet Take 1 tablet (81 mg total) by mouth daily. Start taking on:  10/25/2016   atorvastatin 40 MG tablet Commonly known as:  LIPITOR Take 1 tablet (40 mg total) by mouth daily at 6 PM.   Calcium-D 600-400 MG-UNIT Tabs Take 1 tablet by mouth 2 (two) times daily with a meal.   citalopram 20 MG tablet Commonly known as:  CELEXA Take 20 mg by mouth daily.   clopidogrel 75 MG tablet Commonly known as:  PLAVIX Take 75 mg by mouth daily.   fluticasone 50 MCG/ACT nasal spray Commonly known as:  FLONASE Place 2 sprays into both nostrils daily.   HYDROcodone-acetaminophen 5-325 MG tablet Commonly known as:  NORCO/VICODIN Take 1 tablet by mouth every 6 (six) hours as needed for moderate pain.   ICAPS AREDS FORMULA PO Take 1 tablet by mouth 2 (two) times daily.   MAPAP 500 MG tablet Generic drug:  acetaminophen Take 1,000 mg by mouth 3 (three) times daily. Pt is also able to take every six hours as needed for pain.   meclizine  25 MG tablet Commonly known as:  ANTIVERT Take 25 mg by mouth 3 (three) times daily as needed for dizziness.   mirtazapine 15 MG tablet Commonly known as:  REMERON Take 15 mg by mouth at bedtime.   oxybutynin 10 MG 24 hr tablet Commonly known as:  DITROPAN-XL Take 10 mg by mouth daily.   pantoprazole 40 MG tablet Commonly known as:  PROTONIX Take 40 mg by mouth daily.   REFRESH PLUS 0.5 % Soln Generic drug:   carboxymethylcellulose 1 drop 3 (three) times daily as needed.   VOLTAREN 1 % Gel Generic drug:  diclofenac sodium Apply topically 4 (four) times daily as needed.        DISCHARGE INSTRUCTIONS:   1. PCP follow-up in 1-2 weeks 2. Neurology follow-up in 1 week and will need an EEG as outpatient  DIET:   Cardiac diet  ACTIVITY:   Activity as tolerated  OXYGEN:   Home Oxygen: No.  Oxygen Delivery: room air  DISCHARGE LOCATION:   home   If you experience worsening of your admission symptoms, develop shortness of breath, life threatening emergency, suicidal or homicidal thoughts you must seek medical attention immediately by calling 911 or calling your MD immediately  if symptoms less severe.  You Must read complete instructions/literature along with all the possible adverse reactions/side effects for all the Medicines you take and that have been prescribed to you. Take any new Medicines after you have completely understood and accpet all the possible adverse reactions/side effects.   Please note  You were cared for by a hospitalist during your hospital stay. If you have any questions about your discharge medications or the care you received while you were in the hospital after you are discharged, you can call the unit and asked to speak with the hospitalist on call if the hospitalist that took care of you is not available. Once you are discharged, your primary care physician will handle any further medical issues. Please note that NO REFILLS for any discharge medications will be authorized once you are discharged, as it is imperative that you return to your primary care physician (or establish a relationship with a primary care physician if you do not have one) for your aftercare needs so that they can reassess your need for medications and monitor your lab values.    On the day of Discharge:  VITAL SIGNS:   Blood pressure (!) 163/69, pulse 74, temperature 98.4 F (36.9 C),  temperature source Oral, resp. rate 20, height 5\' 2"  (1.575 m), weight 57.4 kg (126 lb 9.6 oz), SpO2 97 %.  PHYSICAL EXAMINATION:    GENERAL:  83 y.o.-year-old patient lying in the bed with no acute distress.  EYES: Pupils equal, round, reactive to light and accommodation. No scleral icterus. Extraocular muscles intact.  HEENT: Head atraumatic, normocephalic. Oropharynx and nasopharynx clear.  NECK:  Supple, no jugular venous distention. No thyroid enlargement, no tenderness.  LUNGS: Normal breath sounds bilaterally, no wheezing, rhonchi or crepitation. No use of accessory muscles of respiration. Fine bibasilar crackles heard CARDIOVASCULAR: S1, S2 normal. No  rubs, or gallops. Loud 3/6 systolic murmur in the aortic area ABDOMEN: Soft, nontender, nondistended. Bowel sounds present. No organomegaly or mass.  EXTREMITIES: No pedal edema, cyanosis, or clubbing.  NEUROLOGIC: Minimal facial droop noted otherwise cranial nerves are intact. Speech is much more clear now. Muscle strength 5/5 on the left upper and lower extremities, 3/5 on the right side. Sensation intact. Gait not checked.  PSYCHIATRIC: The  patient is alert and oriented x 2-3.  SKIN: No obvious rash, lesion, or ulcer.  DATA REVIEW:   CBC  Recent Labs Lab 10/23/16 0248  WBC 10.0  HGB 13.4  HCT 39.0  PLT 215    Chemistries   Recent Labs Lab 10/23/16 0248  NA 138  K 3.7  CL 100*  CO2 29  GLUCOSE 84  BUN 12  CREATININE 0.63  CALCIUM 9.0  AST 25  ALT 12*  ALKPHOS 81  BILITOT 0.7     Microbiology Results  Results for orders placed or performed during the hospital encounter of 10/23/16  Urine Culture     Status: Abnormal   Collection Time: 10/23/16  2:48 AM  Result Value Ref Range Status   Specimen Description URINE, CATHETERIZED  Final   Special Requests Normal  Final   Culture MULTIPLE SPECIES PRESENT, SUGGEST RECOLLECTION (A)  Final   Report Status 10/24/2016 FINAL  Final  MRSA PCR Screening     Status:  None   Collection Time: 10/23/16  7:15 AM  Result Value Ref Range Status   MRSA by PCR NEGATIVE NEGATIVE Final    Comment:        The GeneXpert MRSA Assay (FDA approved for NASAL specimens only), is one component of a comprehensive MRSA colonization surveillance program. It is not intended to diagnose MRSA infection nor to guide or monitor treatment for MRSA infections.     RADIOLOGY:  Ct Head Wo Contrast  Result Date: 10/24/2016 CLINICAL DATA:  Dysphasia.  Episodes of unresponsiveness EXAM: CT HEAD WITHOUT CONTRAST TECHNIQUE: Contiguous axial images were obtained from the base of the skull through the vertex without intravenous contrast. COMPARISON:  October 23, 2016 and April 30, 2015 FINDINGS: Brain: Mild diffuse atrophy is stable. Slight ex vacuo phenomenon noted in the left lateral ventricle, stable. There is no intracranial mass, hemorrhage, extra-axial fluid collection, or midline shift. There is a prior focal infarct involving the posterior left lentiform nucleus with decreased attenuation in portions of the posterior aspects of the internal and external capsules as well as in a portion of the left thalamus. There is a prior small lacunar infarct in the posterior limb of the right internal capsule immediately adjacent to the tail of the caudate nucleus on the right. Patchy small vessel disease is noted throughout the centra semiovale bilaterally. No new gray-white compartment lesion is evident. There is no acute infarct evident. Vascular: There are no blastic or lytic bone lesions. There is calcification in each carotid siphon. Skull: There are no blastic or lytic bone lesions. Sinuses/Orbits: There is mucosal thickening in several ethmoid air cells bilaterally. Visualized paranasal sinuses elsewhere clear. Visualized orbits appear symmetric bilaterally. Other: Mastoid air cells are clear. IMPRESSION: Stable atrophy. Prior infarcts involving portions of the posterior left lentiform  nucleus, left thalamus, and the posterior aspect of the right internal capsule. There is patchy small vessel disease in the centra semiovale bilaterally. There is no acute infarct evident on this study. No hemorrhage, mass, or extra-axial fluid collection. There are foci of arteriovascular calcification bilaterally. There is ethmoid sinus disease bilaterally. Electronically Signed   By: Bretta Bang III M.D.   On: 10/24/2016 10:50   US Carotid Bilateral (at [facility] And [facility] Only)  Result Date: 10/23/2016 CLINICAL DATA:  Dysphagia. History of hypertension, stroke, right-sided carotid stent, aortic valve replacement and pacemaker. Former smoker. EXAM: BILATERAL CAROTID DUPLEX ULTRASOUND TECHNIQUE: Wallace Cullens scale imaging, color Doppler and duplex ultrasound were performed of bilateral carotid and  vertebral arteries in the neck. COMPARISON:  None. FINDINGS: Criteria: Quantification of carotid stenosis is based on velocity parameters that correlate the residual internal carotid diameter with NASCET-based stenosis levels, using the diameter of the distal internal carotid lumen as the denominator for stenosis measurement. The following velocity measurements were obtained: RIGHT ICA:  84/9 cm/sec CCA:  67/12 cm/sec SYSTOLIC ICA/CCA RATIO:  1.3 DIASTOLIC ICA/CCA RATIO:  0.8 ECA:  74 cm/sec LEFT ICA:  78/13 cm/sec CCA:  80/7 cm/sec SYSTOLIC ICA/CCA RATIO:  1.0 DIASTOLIC ICA/CCA RATIO:  2.0 ECA:  184 cm/sec RIGHT CAROTID ARTERY: Carotid stent appears widely patent without evidence of intimal thickening or recurrent atherosclerotic plaque. There are no elevated peak systolic velocities within the interrogated course the right internal carotid artery to suggest a hemodynamically significant stenosis. RIGHT VERTEBRAL ARTERY:  Antegrade Flow LEFT CAROTID ARTERY: There is a moderate amount of eccentric mixed echogenic partially shadowing plaque involving the origin and proximal aspects of the left internal carotid artery (image 54),  not resulting in elevated peak systolic velocities within the interrogated course the left internal carotid artery to suggest a hemodynamically significant stenosis. LEFT VERTEBRAL ARTERY:  Antegrade flow IMPRESSION: 1. Post right carotid stent placement without evidence of recurrent or residual hemodynamically significant stenosis. 2. Moderate amount of left-sided atherosclerotic plaque, not resulting in a hemodynamically significant stenosis. Electronically Signed   By: Simonne ComeJohn  Watts M.D.   On: 10/23/2016 11:56     Management plans discussed with the patient, family and they are in agreement.  CODE STATUS:     Code Status Orders        Start     Ordered   10/23/16 0603  Do not attempt resuscitation (DNR)  Continuous    Question Answer Comment  In the event of cardiac or respiratory ARREST Do not call a "code blue"   In the event of cardiac or respiratory ARREST Do not perform Intubation, CPR, defibrillation or ACLS   In the event of cardiac or respiratory ARREST Use medication by any route, position, wound care, and other measures to relive pain and suffering. May use oxygen, suction and manual treatment of airway obstruction as needed for comfort.      10/23/16 0602    Code Status History    Date Active Date Inactive Code Status Order ID Comments User Context   04/30/2015  3:18 PM 05/01/2015  4:55 PM DNR [id number]  Houston SirenSainani, Vivek J, MD Inpatient    Advance Directive Documentation     Most Recent Value  Type of Advance Directive  Healthcare Power of Attorney, Living will, Out of facility DNR (pink MOST or yellow form)  Pre-existing out of facility DNR order (yellow form or pink MOST form)  Yellow form placed in chart (order not valid for inpatient use)  "MOST" Form in Place?  -      TOTAL TIME TAKING CARE OF THIS PATIENT: 38 minutes.    Enid BaasKALISETTI,Aloysuis Ribaudo M.D on 10/24/2016 at 11:18 AM  Between 7am to 6pm - Pager - [phone]  After 6pm go to [email or web address] - Air traffic controllerpassword [email or web address]  [facility]  [employer] [facility]  Office  [phone]  CC: Primary care physician; Kandyce RudBabaoff, Marcus, MD   Note: This dictation was prepared with Dragon dictation along with smaller phrase technology. Any transcriptional errors that result from this process are unintentional.

## 2016-10-24 NOTE — Care Management Note (Addendum)
Case Management Note  Patient Details  Name: Denise Calderon MRN: [id number] Date of Birth: 1923-12-28  Subjective/Objective:  Discussed discharge planning with [name]'s daughter. [name] is returning to [facility] today and a referral for HH-PT, RN was called to [name] at [facility].                   Action/Plan:   Expected Discharge Date:  10/24/16               Expected Discharge Plan:   10/24/16  In-House Referral:     Discharge planning Services     Post Acute Care Choice:   Yes Choice offered to:   Daughter  DME Arranged:    DME Agency:     HH Arranged:   RN, PT HH Agency:   [facility]  Status of Service:   Completed  If discussed at Long Length of Stay Meetings, dates discussed:    Additional Comments:  [name] A, RN 10/24/2016, 11:26 AM

## 2016-10-24 NOTE — Clinical Social Work Note (Signed)
The patient will discharge today to [name] via family transportation. The packet has been delivered to her chart, and the RNCM has discussed home health options with the family. The CSW will continue to follow pending any additional discharge needs.  Denise PonderKaren Martha Tabbetha Kutscher, MSW, Theresia MajorsLCSWA [phone]

## 2016-10-24 NOTE — Progress Notes (Signed)
Physical Therapy Treatment Patient Details Name: Denise Calderon MRN: [id number] DOB: [birth year] Today's Date: 10/24/2016    History of Present Illness 83 yo female with onset of TIA and remote stroke changes was admitted.  Has PMHx:  pacemaker, SSS, aortic valve replacement, CAD    PT Comments    Pt ready for session.  Sleepy with eyes closed but participated as asked. .Participated in exercises as described below.  To edge of bed with mod assist.  Attempts to assist.  Sitting with mod a x 1 to prevent lob/fall.  Stand pivot transfer to recliner at bedside with max a x 1.  Generally fatigued with activity but reports being comfortable in chair.  Reviewed use of call bell for assist.   Follow Up Recommendations  SNF     Equipment Recommendations  None recommended by PT    Recommendations for Other Services       Precautions / Restrictions Precautions Precautions: Fall Precaution Comments: telemetry Restrictions Weight Bearing Restrictions: No    Mobility  Bed Mobility Overal bed mobility: Needs Assistance Bed Mobility: Supine to Sit     Supine to sit: Mod assist        Transfers Overall transfer level: Needs assistance   Transfers: Stand Pivot Transfers   Stand pivot transfers: Max assist          Ambulation/Gait             General Gait Details: non ambulatory   Stairs            Wheelchair Mobility    Modified Rankin (Stroke Patients Only)       Balance Overall balance assessment: Needs assistance Sitting-balance support: Feet supported Sitting balance-Leahy Scale: Poor     Standing balance support: Bilateral upper extremity supported Standing balance-Leahy Scale: Poor                              Cognition Arousal/Alertness: Lethargic Behavior During Therapy: WFL for tasks assessed/performed Overall Cognitive Status: Within Functional Limits for tasks assessed                                  General Comments: sleepy today but appropriate conversation when asked.      Exercises Other Exercises Other Exercises: supine BLE x 10 AAROM ankle pumps, heel slides, SLR, ab/add     General Comments        Pertinent Vitals/Pain Pain Assessment: No/denies pain    Home Living                      Prior Function            PT Goals (current goals can now be found in the care plan section) Progress towards PT goals: Progressing toward goals    Frequency    Min 2X/week      PT Plan      Co-evaluation              AM-PAC PT "6 Clicks" Daily Activity  Outcome Measure  Difficulty turning over in bed (including adjusting bedclothes, sheets and blankets)?: Total Difficulty moving from lying on back to sitting on the side of the bed? : Total Difficulty sitting down on and standing up from a chair with arms (e.g., wheelchair, bedside commode, etc,.)?: Total Help needed moving to and from a bed to chair (  including a wheelchair)?: A Lot Help needed walking in hospital room?: Total Help needed climbing 3-5 steps with a railing? : Total 6 Click Score: 7    End of Session Equipment Utilized During Treatment: Gait belt Activity Tolerance: Patient limited by fatigue Patient left: in chair;with chair alarm set;with call bell/phone within reach   Hemiplegia - Right/Left: Right Hemiplegia - dominant/non-dominant: Dominant Hemiplegia - caused by: Cerebral infarction     Time: 1610-9604 PT Time Calculation (min) (ACUTE ONLY): 15 min  Charges:  $Therapeutic Activity: 8-22 mins                    G Codes:       Danielle Dess, PTA 10/24/16, 10:27 AM

## 2016-10-24 NOTE — Progress Notes (Signed)
Discharge instructions along with home medications and follow up gone over with patient and daughter. Both verbalize that they understood instructions. Pt being discharged to the [facility] via her daughter.  IV removed. Pt being discharged on room air, no distress noted. Otilio JeffersonMadelyn S Fenton, RN

## 2016-10-24 NOTE — Plan of Care (Signed)
Problem: Safety: Goal: Ability to remain free from injury will improve Outcome: Progressing High fall risk; bed alarm activated.

## 2016-10-24 NOTE — [place] FL2 (Signed)
[place] MEDICA[place] FL2 LEVEL OF CARE SCREENING TOOL     IDENTIFICATION  Patient Name: Denise Calderon Birthdate: [birth year] Sex: female Admission Date (Current Location): 10/23/2016  [place] and [place] Number:  Chiropodist and Address:  [facility], [address] [place], [place] [address]      Provider Number: [id number]  Attending Physician Name and Address:  Enid Baas, MD  Relative Name and Phone Number:       Current Level of Care: Hospital Recommended Level of Care: Assisted Living Facility Prior Approval Number:    Date Approved/Denied:   PASRR Number: [id number]  Discharge Plan: Domiciliary (Rest home) (ALF)    Current Diagnoses: Patient Active Problem List   Diagnosis Date Noted  . TIA (transient ischemic attack) 10/23/2016  . Carotid artery occlusion with infarction (HCC) 02/26/2016  . CVA (cerebral vascular accident) (HCC) 02/26/2016  . Essential hypertension 02/26/2016  . Hyperlipidemia 02/26/2016  . Benign paroxysmal positional vertigo 02/26/2016  . Altered mental status 04/30/2015    Orientation RESPIRATION BLADDER Height & Weight     Self, Time, Situation, Place  Normal Continent Weight: 126 lb 9.6 oz (57.4 kg) Height:  5\' 2"  (157.5 cm)  BEHAVIORAL SYMPTOMS/MOOD NEUROLOGICAL BOWEL NUTRITION STATUS      Continent Diet (Heart healthy)  AMBULATORY STATUS COMMUNICATION OF NEEDS Skin   Extensive Assist Verbally Normal                       Personal Care Assistance Level of Assistance  Bathing, Feeding, Dressing Bathing Assistance: Maximum assistance Feeding assistance: Independent Dressing Assistance: Maximum assistance     Functional Limitations Info             SPECIAL CARE FACTORS FREQUENCY  PT (By licensed PT), OT (By licensed OT), Speech therapy     PT Frequency: TBD by home health  OT Frequency: TBD by home health      Speech Therapy Frequency: TBD by home health        Contractures Contractures Info: Present    Additional Factors Info  Code Status Code Status Info: DNR             Current Medications (10/24/2016):  This is the current hospital active medication list Current Facility-Administered Medications  Medication Dose Route Frequency Provider Last Rate Last Dose  . 0.9 %  sodium chloride infusion   Intravenous Continuous Ihor Austin, MD 50 mL/hr at 10/24/16 0040    . acetaminophen (TYLENOL) tablet 650 mg  650 mg Oral Q4H PRN Ihor Austin, MD       Or  . acetaminophen (TYLENOL) solution 650 mg  650 mg Per Tube Q4H PRN Pyreddy, Vivien Rota, MD       Or  . acetaminophen (TYLENOL) suppository 650 mg  650 mg Rectal Q4H PRN Pyreddy, Pavan, MD      . amLODipine (NORVASC) tablet 5 mg  5 mg Oral Daily Enid Baas, MD      . aspirin EC tablet 81 mg  81 mg Oral Daily Thana Farr, MD   81 mg at 10/24/16 0945  . atorvastatin (LIPITOR) tablet 40 mg  40 mg Oral q1800 Enid Baas, MD   40 mg at 10/23/16 1719  . calcium-vitamin D (OSCAL WITH D) 500-200 MG-UNIT per tablet 1 tablet  1 tablet Oral BID WC Ihor Austin, MD   1 tablet at 10/24/16 0735  . citalopram (CELEXA) tablet 20 mg  20 mg Oral Daily Ihor Austin, MD  20 mg at 10/24/16 0945  . clopidogrel (PLAVIX) tablet 75 mg  75 mg Oral Daily Pyreddy, Vivien RotaPavan, MD   75 mg at 10/24/16 0945  . enoxaparin (LOVENOX) injection 40 mg  40 mg Subcutaneous Q24H Ihor AustinPyreddy, Pavan, MD   40 mg at 10/24/16 [id number]  . fluticasone (FLONASE) 50 MCG/ACT nasal spray 2 spray  2 spray Each Nare Daily Pyreddy, Pavan, MD   2 spray at 10/24/16 1000  . HYDROcodone-acetaminophen (NORCO/VICODIN) 5-325 MG per tablet 1 tablet  1 tablet Oral Q6H PRN Pyreddy, Vivien RotaPavan, MD      . meclizine (ANTIVERT) tablet 25 mg  25 mg Oral TID PRN Ihor AustinPyreddy, Pavan, MD      . mirtazapine (REMERON) tablet 15 mg  15 mg Oral QHS Ihor AustinPyreddy, Pavan, MD   15 mg at 10/23/16 2140  . oxybutynin (DITROPAN-XL) 24 hr tablet 10 mg  10 mg Oral Daily Pyreddy,  Vivien RotaPavan, MD   10 mg at 10/24/16 0945  . pantoprazole (PROTONIX) EC tablet 40 mg  40 mg Oral Daily Pyreddy, Vivien RotaPavan, MD   40 mg at 10/24/16 0945  . polyvinyl alcohol (LIQUIFILM TEARS) 1.4 % ophthalmic solution 1 drop  1 drop Both Eyes TID PRN Pyreddy, Vivien RotaPavan, MD      . senna-docusate (Senokot-S) tablet 1 tablet  1 tablet Oral QHS PRN Ihor AustinPyreddy, Pavan, MD         Discharge Medications: DISCHARGE MEDICATIONS:   Allergies as of 10/24/2016   No Known Allergies        Medication List    TAKE these medications   amLODipine 5 MG tablet Commonly known as:  NORVASC Take 1 tablet (5 mg total) by mouth daily.   aspirin 81 MG EC tablet Take 1 tablet (81 mg total) by mouth daily. Start taking on:  10/25/2016   atorvastatin 40 MG tablet Commonly known as:  LIPITOR Take 1 tablet (40 mg total) by mouth daily at 6 PM.   Calcium-D 600-400 MG-UNIT Tabs Take 1 tablet by mouth 2 (two) times daily with a meal.   citalopram 20 MG tablet Commonly known as:  CELEXA Take 20 mg by mouth daily.   clopidogrel 75 MG tablet Commonly known as:  PLAVIX Take 75 mg by mouth daily.   fluticasone 50 MCG/ACT nasal spray Commonly known as:  FLONASE Place 2 sprays into both nostrils daily.   HYDROcodone-acetaminophen 5-325 MG tablet Commonly known as:  NORCO/VICODIN Take 1 tablet by mouth every 6 (six) hours as needed for moderate pain.   ICAPS AREDS FORMULA PO Take 1 tablet by mouth 2 (two) times daily.   MAPAP 500 MG tablet Generic drug:  acetaminophen Take 1,000 mg by mouth 3 (three) times daily. Pt is also able to take every six hours as needed for pain.   meclizine 25 MG tablet Commonly known as:  ANTIVERT Take 25 mg by mouth 3 (three) times daily as needed for dizziness.   mirtazapine 15 MG tablet Commonly known as:  REMERON Take 15 mg by mouth at bedtime.   oxybutynin 10 MG 24 hr tablet Commonly known as:  DITROPAN-XL Take 10 mg by mouth daily.   pantoprazole 40 MG  tablet Commonly known as:  PROTONIX Take 40 mg by mouth daily.   REFRESH PLUS 0.5 % Soln Generic drug:  carboxymethylcellulose 1 drop 3 (three) times daily as needed.   VOLTAREN 1 % Gel Generic drug:  diclofenac sodium Apply topically 4 (four) times daily as needed.     Relevant Imaging Results:  Relevant Lab Results:   Additional Information SS# [id number]  Judi Cong, LCSW

## 2017-02-25 ENCOUNTER — Other Ambulatory Visit (INDEPENDENT_AMBULATORY_CARE_PROVIDER_SITE_OTHER): Payer: Self-pay | Admitting: Vascular Surgery

## 2017-02-25 DIAGNOSIS — I6523 Occlusion and stenosis of bilateral carotid arteries: Secondary | ICD-10-CM

## 2017-02-28 ENCOUNTER — Ambulatory Visit (INDEPENDENT_AMBULATORY_CARE_PROVIDER_SITE_OTHER): Payer: Medicare Other | Admitting: Vascular Surgery

## 2017-02-28 ENCOUNTER — Encounter (INDEPENDENT_AMBULATORY_CARE_PROVIDER_SITE_OTHER): Payer: Medicare Other

## 2017-10-17 IMAGING — CT CT HEAD W/O CM
3 series · 14 of 47 positions shown, 16 images · non-contrast
Comparison: October 23, 2016 and April 30, 2015

CLINICAL DATA: Dysphasia.  Episodes of unresponsiveness

EXAM:
CT HEAD WITHOUT CONTRAST
TECHNIQUE: Contiguous axial images were obtained from the base of the skull
through the vertex without intravenous contrast.

[Series 2: head wo · axial · 0.47mm/px · z∈[-153,-28]mm · 8 of 30 slices shown, 10 images]
[im 3/30  brain]
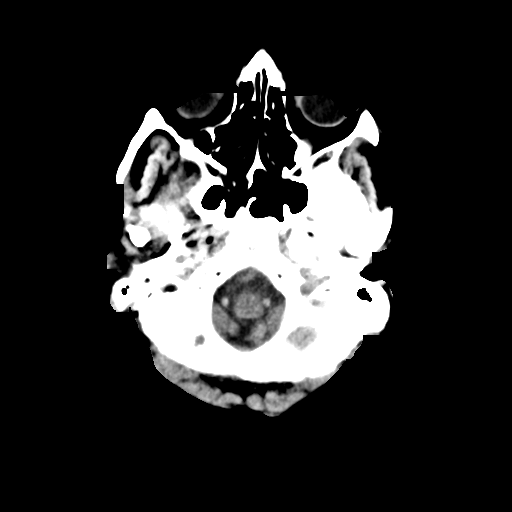
[im 3/30  bone]
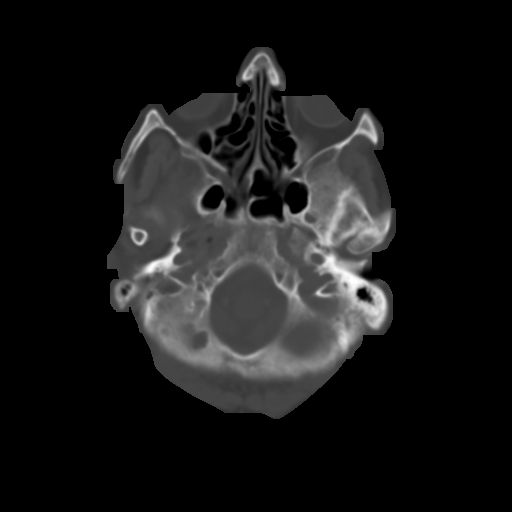
[im 7/30  brain]
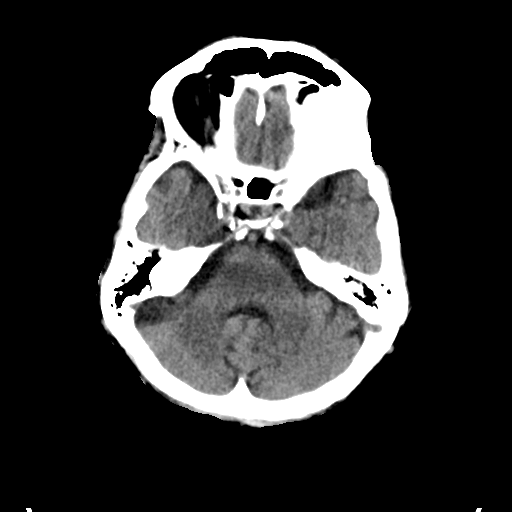
[im 10/30  brain]
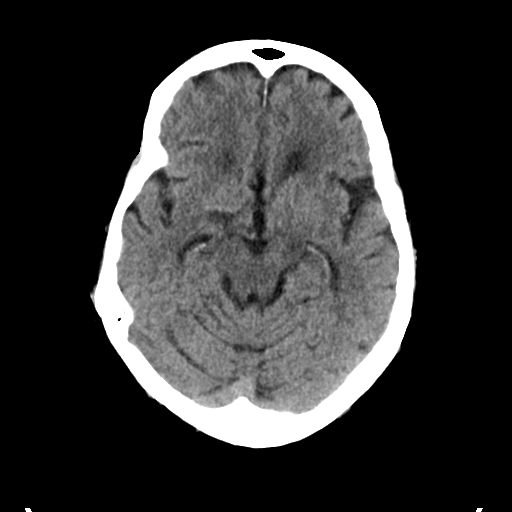
[im 14/30  brain]
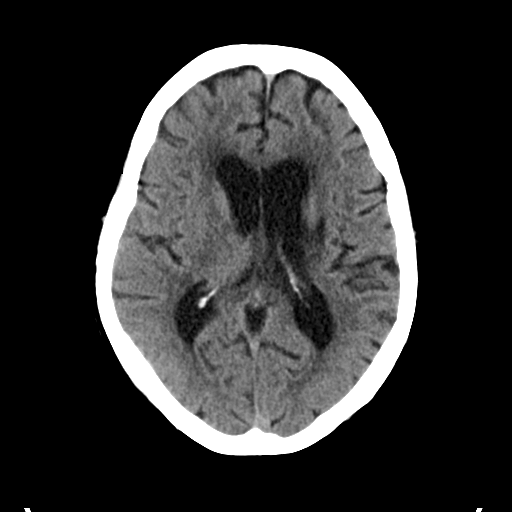
[im 17/30  brain]
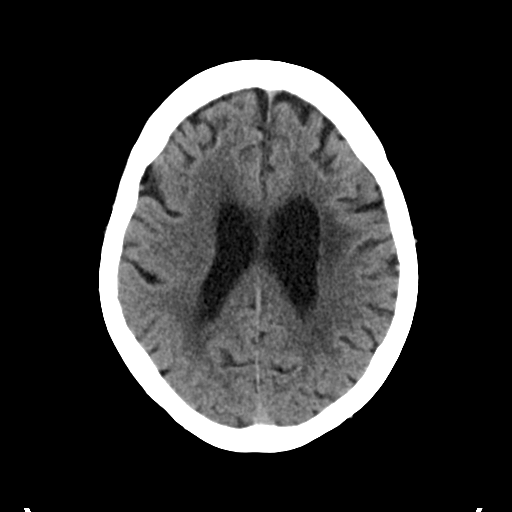
[im 17/30  bone]
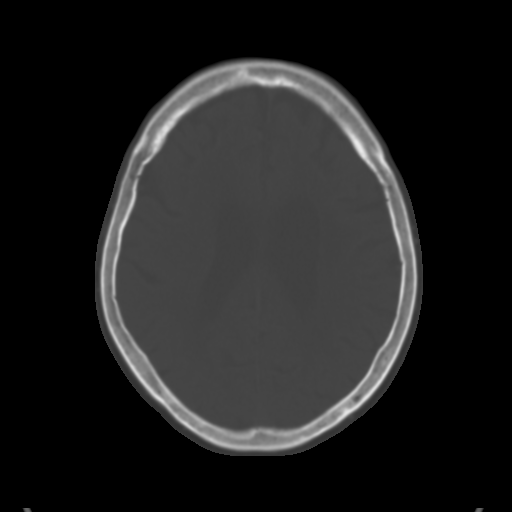
[im 21/30  brain]
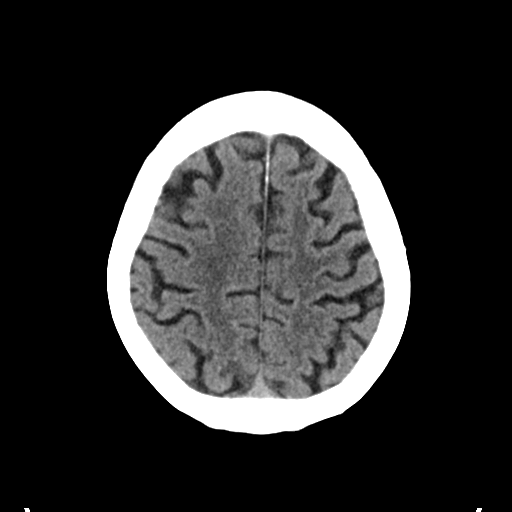
[im 24/30  brain]
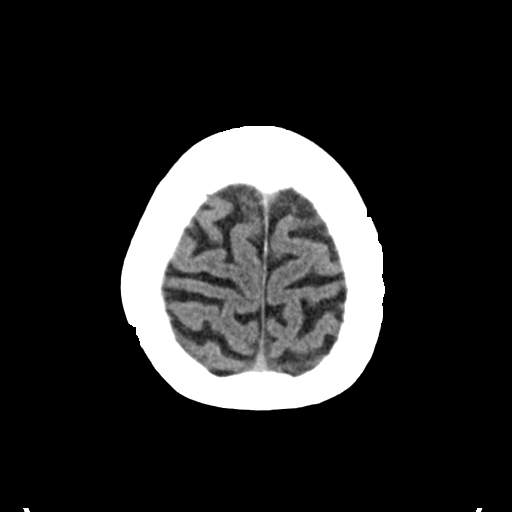
[im 28/30  brain]
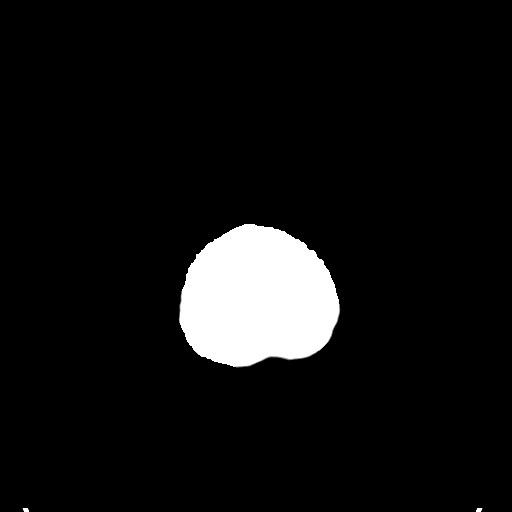

[Series 4: coronal soft tissue · coronal · 0.29mm/px · 3 of 67 slices shown]
[im 23/67  brain]
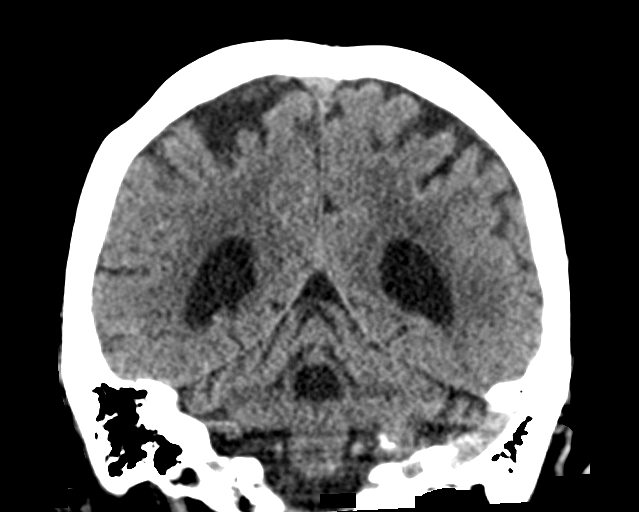
[im 30/67  brain]
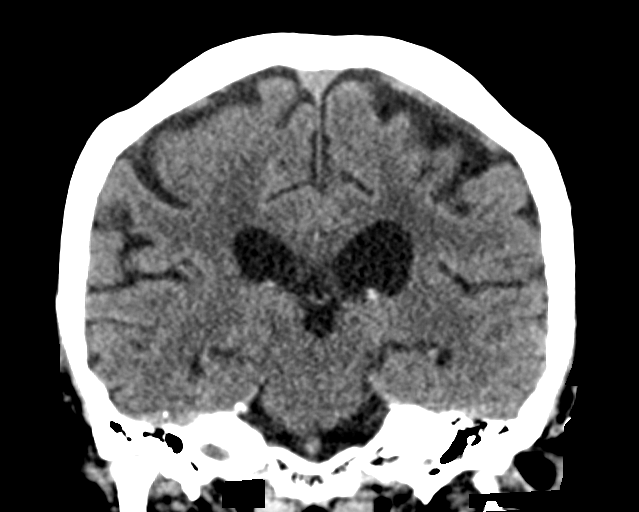
[im 37/67  brain]
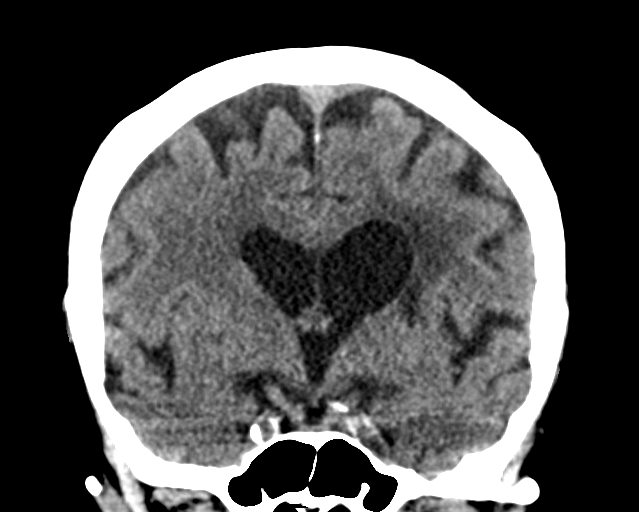

[Series 5: sagittal soft tissue · sagittal · 0.28mm/px · 3 of 53 slices shown]
[im 18/53  brain]
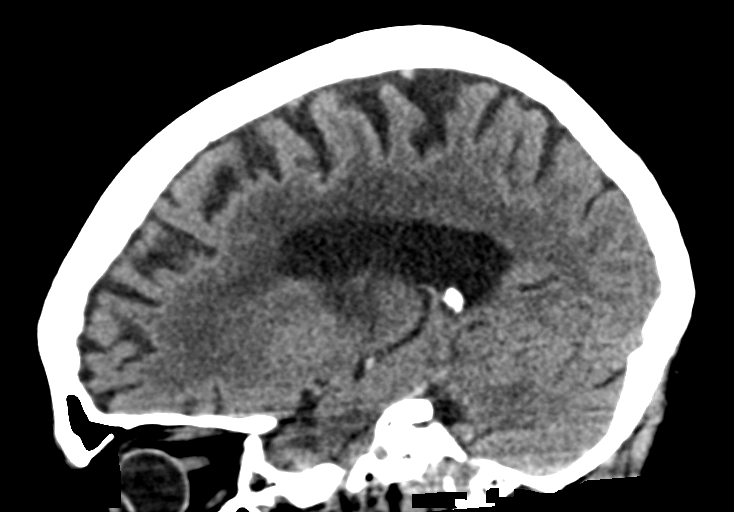
[im 27/53  brain]
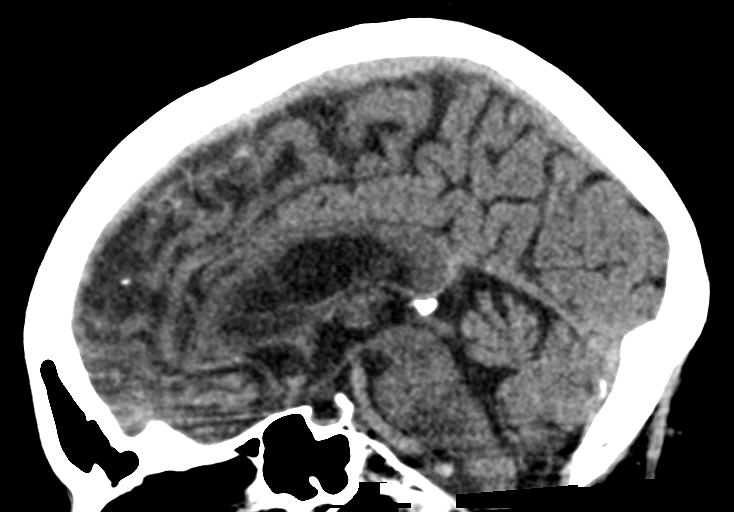
[im 35/53  brain]
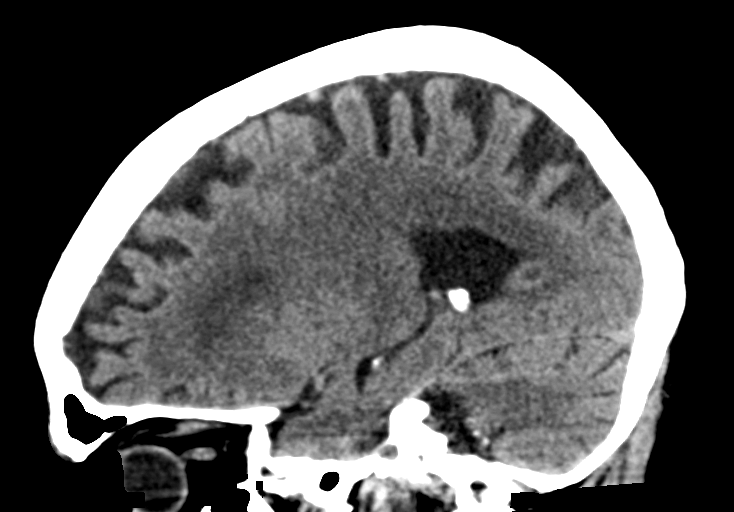

[14 of 47 positions shown; findings below may reference images not displayed]

FINDINGS: Brain: Mild diffuse atrophy is stable. Slight ex vacuo phenomenon
noted in the left lateral ventricle, stable. There is no
intracranial mass, hemorrhage, extra-axial fluid collection, or
midline shift. There is a prior focal infarct involving the
posterior left lentiform nucleus with decreased attenuation in
portions of the posterior aspects of the internal and external
capsules as well as in a portion of the left thalamus. There is a
prior small lacunar infarct in the posterior limb of the right
internal capsule immediately adjacent to the tail of the caudate
nucleus on the right. Patchy small vessel disease is noted
throughout the centra semiovale bilaterally. No new gray-white
compartment lesion is evident. There is no acute infarct evident.

Vascular: There are no blastic or lytic bone lesions. There is
calcification in each carotid siphon.

Skull: There are no blastic or lytic bone lesions.

Sinuses/Orbits: There is mucosal thickening in several ethmoid air
cells bilaterally. Visualized paranasal sinuses elsewhere clear.
Visualized orbits appear symmetric bilaterally.

Other: Mastoid air cells are clear.
IMPRESSION: Stable atrophy. Prior infarcts involving portions of the posterior
left lentiform nucleus, left thalamus, and the posterior aspect of
the right internal capsule. There is patchy small vessel disease in
the centra semiovale bilaterally. There is no acute infarct evident
on this study. No hemorrhage, mass, or extra-axial fluid collection.
There are foci of arteriovascular calcification bilaterally. There
is ethmoid sinus disease bilaterally.

## 2017-12-15 ENCOUNTER — Ambulatory Visit: Payer: Medicare Other | Admitting: Podiatry

## 2017-12-26 ENCOUNTER — Ambulatory Visit: Payer: Medicare Other | Admitting: Podiatry

## 2018-03-28 ENCOUNTER — Encounter: Payer: Self-pay | Admitting: Physical Therapy

## 2018-03-28 ENCOUNTER — Ambulatory Visit: Payer: Medicare Other | Attending: Unknown Physician Specialty | Admitting: Physical Therapy

## 2018-03-28 DIAGNOSIS — R42 Dizziness and giddiness: Secondary | ICD-10-CM | POA: Insufficient documentation

## 2018-03-28 NOTE — Patient Instructions (Signed)
Access Code: [email or web address]

## 2018-03-28 NOTE — Therapy (Signed)
[facility] MAIN [facility] SERVICES [address] [place], [place], [address] Phone: [phone]   Fax:  [phone]  Physical Therapy Evaluation  Patient Details  Name: Denise Calderon MRN: [id number] Date of Birth: Sep 15, 1923 No data recorded  Encounter Date: 03/28/2018  PT End of Session - 03/28/18 1323    Visit Number  1    Number of Visits  9    Date for PT Re-Evaluation  05/23/18    PT Start Time  1324    PT Stop Time  1422    PT Time Calculation (min)  58 min    Activity Tolerance  Patient tolerated treatment well    Behavior During Therapy  [facility] for tasks assessed/performed       Past Medical History:  Diagnosis Date  . Anemia   . Aortic valve defect    Aortic valve replaced  . Hypertension   . Pacemaker   . Stroke (HCC)   . Vertigo     Past Surgical History:  Procedure Laterality Date  . aortic valve replaced    . CAROTID STENT Left   . CTR Left   . HIP FRACTURE SURGERY Right   . PACEMAKER LEAD REMOVAL N/A 10/29/2015   Procedure: PACEMAKER generator change out;  Surgeon: Marcina Millard, MD;  Location: [facility] ORS;  Service: Cardiovascular;  Laterality: N/A;    There were no vitals filed for this visit.   Subjective Assessment - 04/03/18 1739    Subjective  Patient    Patient is accompained by:  Family member   daughter   Pertinent History  This is a 84 year old female that presents with complaints of dizziness and intermittent vertigo. History provided largely by patient's daughter as well as the patient. Patient had her first stroke 12 years ago and she lost strength in her right UE and LE at that time. 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 Patient also had a pacemaker placed in 2001. Patient was in a coma for 3 weeks after the cardiac surgery. Patient used a HW after the first stroke and she fell and broke her hip in 2014 and had hip  surgery. Patient was unable to walk after the hip surgery. Patient's daughter reports that patient may have had TIAs after the first stroke as well. Patient started having dizzy spells a few years ago but reports it has worsened over the past year and patient unable to specifically identify how long this has been occurring. Patient reports vertigo; she is not sure if she has been having vertigo for years or just in the past few months. Patient reports the vertigo lasts hours sometimes. Patient reports lying down and closing her eyes, and meclizine helps reduce her symptoms. Patient unable to report anything that brings on the dizziness. Patient's daughter reports that her mother has some anxiety at times. Patient will keep her eyes closed and she will not respond during episodes of dizziness and she gets very weak. Patient's daughter states the patient gets really weak and will not eat anything and gets confused after an episode of vertigo. No history of seizures per patient, family report and per medical record that the patient has been diagnosed with. Patient's daughter reports that the patient has been having episodes of vertigo spells. Patient's dizziness has gotten worse recently.     Patient Stated Goals  to have decreased dizziness  Currently in Pain?  No/denies       [facility] PT Assessment - 04/03/18 1835      Assessment   Medical Diagnosis  dizziness and giddiness    Referring Provider (PT)  Dr. Linus Salmons    Prior Therapy  no prior vestibular therapy      Precautions   Precautions  Fall      Restrictions   Weight Bearing Restrictions  No      Balance Screen   Has the patient fallen in the past 6 months  No    Has the patient had a decrease in activity level because of a fear of falling?   No    Is the patient reluctant to leave their home because of a fear of falling?   No      Home Environment   Living Environment  Assisted living    Home Equipment  Tub bench;Toilet riser    pt is non-ambulatory; wheelchair; grab bars; hospital bed     Prior Function   Level of Independence  --   Patient is non-ambulatory and requires total assistance of 2     Cognition   Overall Cognitive Status  Within Functional Limits for tasks assessed         VESTIBULAR AND BALANCE EVALUATION   HISTORY:  Subjective history of current problem:  This is a 84 year old female that presents with complaints of dizziness and intermittent vertigo. History provided largely by patient's daughter. Patient had her first stroke 12 years ago and she lost strength in her right UE and LE at that time. 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 Patient also had a pacemaker placed in 2001. Patient was in a coma for 3 weeks after the cardiac surgery. Patient used a HW after the first stroke and she fell and broke her hip in 2014 and had hip surgery. Patient was unable to walk after the hip surgery. Patient's daughter reports that patient may have had TIAs after the first stroke as well. Patient started having dizzy spells a few years ago but reports it has worsened over the past year and patient unable to specifically identify how long this has been occurring. Patient reports vertigo; she is not sure if she has been having vertigo for years or just in the past few months. Patient reports the vertigo lasts hours sometimes. Patient reports lying down and closing her eyes, and meclizine helps reduce her symptoms. Patient unable to report anything that brings on the dizziness. Patient's daughter reports that her mother has some anxiety at times. Patient will keep her eyes closed and she will not respond during episodes of dizziness and she gets very weak. Patient's daughter states the patient gets really weak and will not eat anything and gets confused after an episode of vertigo. No history of seizures per patient, family report and per  medical record that the patient has been diagnosed with. Patient's daughter reports that the patient has been having episodes of vertigo spells. Patient's dizziness has gotten worse recently.   Description of dizziness: vertigo, lightheadedness Frequency: every couple of days she has been getting dizziness Duration: patient reports it can be hours at times Symptom nature: patient unable to report Progression of symptoms: worse  Falls (yes/no): yes Number of falls in past 6 months: during a transfer with staff she slipped to the floor  Prior Functional Level: patient  is prior wheelchair bound that requires total assist of two staff members with transfers at her assisted living facility.   Auditory complaints (tinnitus, pain, drainage): denies pain and drainage and states she might have tinnitus sometimes in both ears. Hearing aide in left ear.  Vision (last eye exam, diplopia, recent changes): macular degeneration, patient wears glasses denies other vision issues   EXAMINATION  POSTURE:  Slouched sitting posture with rounded shoulders.   COORDINATION: Finger to Nose:  Dysmetric  left Past Pointing:   Left mod pass pointing present  MUSCULOSKELETAL SCREEN: Cervical Spine ROM: Cervical AROM WFL with decreased left cervical rotation grossly 0-80 degrees without discomfort.   Functional Mobility: Patient is wheelchair bound and requires 2 person dependent lift at her facility for transfers. Patient required Max A of 2 with transfer w/c to/from mat table this date.   Gait: Patient is non-ambulatory at baseline.   OCULOMOTOR / VESTIBULAR TESTING:  Oculomotor Exam- Room Light  Normal Abnormal Comments  Ocular Alignment N    Ocular ROM N    Spontaneous Nystagmus N    Gaze evoked Nystagmus N  Age appropriate  Smooth Pursuit  Abn Patient when going from left upper to left lower field gaze loses tracking and deviates medially and multiple saccades noted   Saccades  Abn Slow speed  especially with left saccade field testing and hypometric saccades  VOR  Abn   VOR Cancellation  deferred   Left Head Impulse  deferred   Right Head Impulse  deferred   Static Acuity   Patient unable to perform this test secondary to speech difficulties  Dynamic Acuity   Patient unable to perform this test secondary to speech difficulties    BPPV TESTS: Tested Right and Left Dix-Hallpike test on inverted mat table and both were negative with no nystagmus observed and patient denied vertigo.   Symptoms Duration Intensity Nystagmus  Left Dix-Hallpike None N/A N/A None observed  Right Dix-Hallpike None N/A N/A None observed   Neuromuscular Re-education: VOR X 1 exercise:  Demonstrated and educated as to VOR X1.  Patient performed VOR X 1 horizontal in sitting 3 reps of 1 minute each with verbal cues for technique.  Patient unable to rate on 0-10 scale but patient reports moderate increased dizziness with this activity.    Ball tracking : Patient performed static sitting while holding ball while moving ball horizontally and then vertically while tracking ball with head and eyes with verbal cues to turn the head. Patient reports mild increased dizziness with this activity.  Added this exercise to HEP and handout provided.    PT Education - 03/28/18 1323    Education Details  discussed plan of care; issued VOR X 1 in sitting and ball follows with horizontal and vertical head turns for HEP.    Person(s) Educated  Patient;Child(ren)    Methods  Explanation;Demonstration;Handout;Verbal cues    Comprehension  Verbalized understanding;Returned demonstration         PT Short Term Goals - 04/03/18 1846      PT SHORT TERM GOAL #1   Title  Patient will report 50% or greater improvement in her symptoms of dizziness in order to have improved quality of life.     Time  4    Period  Weeks    Status  New    Target Date  04/25/18      PT Long Term Goals - 04/03/18 1848      PT LONG TERM  GOAL #1  Title  Patient will report 75% or greater improvement in her symptoms of dizziness and imbalance with provoking motions or positions by    Time  8    Period  Weeks    Status  New    Target Date  05/23/18      PT LONG TERM GOAL #2   Title  Patient will be able to perform home exercise program with assistance of a staff member or family member for self management.     Time  8    Period  Weeks    Status  New    Target Date  05/23/18        Plan - 04/03/18 1739    Clinical Impression Statement  This is a 84 y/o female that reports worsening dizziness and vertigo over the past year. Patient has been wheelchair bound since having a stroke. Patient with abnormal finger to nose coordination testing and abnormal smooth pursuits, VOR, and saccades with oculomtor testing. Patient with negative bilateral Dix-Hallpike tests this date with patient denying vertigo, reporting sustained dizziness and no nystagmus observed. Patient with potential signs of both peripheral and central signs. Patient would benefit from PT services to try to address deficits and goals as set on plan of care and to try to reduce patient's subjective symptoms of dizziness.     History and Personal Factors relevant to plan of care:  age, comorbidities: pacemaker, cardiac disease, stroke, anxiety    Clinical Presentation  Evolving    Clinical Decision Making  Moderate    Rehab Potential  Fair    PT Frequency  1x / week    PT Duration  8 weeks    PT Treatment/Interventions  Vestibular;Canalith Repostioning;Therapeutic exercise;Neuromuscular re-education;Patient/family education;Balance training;Therapeutic activities    PT Next Visit Plan  review HEP, progress exercises as able, try EC with head turns     PT Home Exercise Plan  seated VOR x 1 horizontal 1 min reps, seated ball follows with horizontal and vertical head turns    Consulted and Agree with Plan of Care  Patient;Family member/caregiver       Patient will  benefit from skilled therapeutic intervention in order to improve the following deficits and impairments:     Visit Diagnosis: Dizziness and giddiness     Problem List Patient Active Problem List   Diagnosis Date Noted  . TIA (transient ischemic attack) 10/23/2016  . Carotid artery occlusion with infarction (HCC) 02/26/2016  . CVA (cerebral vascular accident) (HCC) 02/26/2016  . Essential hypertension 02/26/2016  . Hyperlipidemia 02/26/2016  . Benign paroxysmal positional vertigo 02/26/2016  . Altered mental status 04/30/2015   Mardelle Matte PT, DPT [phone] Mardelle Matte 03/28/2018, 4:25 PM  [place] [facility] MAIN [facility] SERVICES [address] [place], [place], [address] Phone: [phone]   Fax:  [phone]  Name: ANTONIQUE LANGFORD MRN: [id number] Date of Birth: 11/21/1923

## 2018-04-03 ENCOUNTER — Other Ambulatory Visit: Payer: Self-pay

## 2018-04-04 ENCOUNTER — Ambulatory Visit: Payer: Medicare Other | Admitting: Physical Therapy

## 2018-04-04 ENCOUNTER — Encounter: Payer: Self-pay | Admitting: Physical Therapy

## 2018-04-04 VITALS — BP 132/54 | HR 71

## 2018-04-04 DIAGNOSIS — R42 Dizziness and giddiness: Secondary | ICD-10-CM

## 2018-04-04 NOTE — Therapy (Addendum)
[place] [facility] [address] [place], [place], [address] Phone: [phone]   Fax:  [phone]  Physical Therapy Treatment  Patient Details  Name: Denise Calderon MRN: [id number] Date of Birth: 1923-10-31 Referring Provider (PT): Dr. Linus Salmonshapman McQueen   Encounter Date: 04/04/2018  PT End of Session - 04/04/18 1359    Visit Number  2    Number of Visits  9    Date for PT Re-Evaluation  05/23/18    PT Start Time  1359    PT Stop Time  1453    PT Time Calculation (min)  54 min    Activity Tolerance  Patient tolerated treatment well    Behavior During Therapy  [facility] for tasks assessed/performed       Past Medical History:  Diagnosis Date  . Anemia   . Aortic valve defect    Aortic valve replaced  . Hypertension   . Pacemaker   . Stroke (HCC)   . Vertigo     Past Surgical History:  Procedure Laterality Date  . aortic valve replaced    . CAROTID STENT Left   . CTR Left   . HIP FRACTURE SURGERY Right   . PACEMAKER LEAD REMOVAL N/A 10/29/2015   Procedure: PACEMAKER generator change out;  Surgeon: Marcina MillardAlexander Paraschos, MD;  Location: [facility] ORS;  Service: Cardiovascular;  Laterality: N/A;    Vitals:   04/04/18 1410 04/04/18 1443  BP: (!) 130/51 (!) 132/54  Pulse: 71   SpO2: 96%     Subjective Assessment - 04/04/18 1358    Subjective  Patient reports she is not hurting but having a large amount of dizziness. Patient states she is not having a headache but reports it feels like "bugs are floating around" on her forehead. Patient states this feeling started yesterday and got worse today. Patient states she is not feeling tired, states she did not eat breakfast.     Patient is accompained by:  Family member   daughter   Pertinent History  This is a 84 year old female that presents with complaints of dizziness and intermittent vertigo. History provided largely by patient's daughter as well as the patient. Patient had her  first stroke 12 years ago and she lost strength in her right UE and LE at that time. 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 Patient also had a pacemaker placed in 2001. Patient was in a coma for 3 weeks after the cardiac surgery. Patient used a HW after the first stroke and she fell and broke her hip in 2014 and had hip surgery. Patient was unable to walk after the hip surgery. Patient's daughter reports that patient may have had TIAs after the first stroke as well. Patient started having dizzy spells a few years ago but reports it has worsened over the past year and patient unable to specifically identify how long this has been occurring. Patient reports vertigo; she is not sure if she has been having vertigo for years or just in the past few months. Patient reports the vertigo lasts hours sometimes. Patient reports lying down and closing her eyes, and meclizine helps reduce her symptoms. Patient unable to report anything that brings on the dizziness. Patient's daughter reports that her mother has some anxiety at times. Patient will keep her eyes closed and she will not respond during episodes of dizziness and she gets  very weak. Patient's daughter states the patient gets really weak and will not eat anything and gets confused after an episode of vertigo. No history of seizures per patient, family report and per medical record that the patient has been diagnosed with. Patient's daughter reports that the patient has been having episodes of vertigo spells. Patient's dizziness has gotten worse recently.     Patient Stated Goals  to have decreased dizziness        Neuromuscular Re-education:  Patient's daughter reports that the patient has been doing the exercises with staff daily and that she has done the exercises some with the patient as well. Patient presents to the clinic with complains of "large" amount of dizziness this  date as patient unable to rate on 0-10 scale. Patient presents sitting in [employer] transport chair with her head in her hands with eyes mostly closed. Patient denies pain or other symptoms and has a difficult time describing how she is feeling. Vital signs obtained and patient's blood pressure was low at 130/51 mmHg. Discussed that low blood pressure can contribute to dizziness symptoms and encouraged patient to try to stay well hydrated. Patient drank one cup of water during the session and repeated blood pressure at the end of session was 132/54 mmHg with patient reporting that she felt better with less dizziness. Patient's daughter reports that this is the only episode of dizziness that the patient has had since being seen in the clinic last Tuesday and that they were encouraged that the patient had been doing better.   VOR X 1 exercise:  Reeducated as to VOR because daughter reports that she was moving the target and having the patient do head/eye follows instead of VOR.   Patient performed VOR X 1 horizontal in sitting 3 reps of 1 minute each with verbal cues to keep her eyes on the target and for speed of movement. Patient reports she is having a lot of dizziness currently and that it has not changed with doing the VOR exercise.    Ball circles: In sitting, patient held small ball in her left hand and did gradually increasing circles while following with eyes and head.   Ball tracking : Patient performed static sitting while holding ball, moving ball horizontally and then vertically while tracking ball with head and eyes with verbal cues to turn the head and keep her eyes on the ball. Repeated activity with therapist tossing ball from side to side horizontally and then vertically while patient tracked ball with head and eyes. Patient did well with being able to track at this increased speed and difficulty level.   Movement with head turns:  Patient nonambulatory therefore therapist moved patient  in the [employer] transport chair 175' forwards and backwards while patient scanned for targets in the hallway. Patient was unable to read the playing cards but she was able to see the card shape and do head turns. Patient reports no change in her dizziness symptoms while performing this activity.   Patient reports she is feeling a little better at end of the session with less dizziness. Patient was no longer holding her head in her hand and was more alert and awake.      PT Education - 04/04/18 1359    Education Details  reviewed HEP and added ball circles and ball toss horiz and vertical with head and eye follows. Encouraged patient to stay well hydrated.   Person(s) Educated  Patient;Child(ren)   patient's daughter   Methods  Explanation;Handout;Verbal  cues;Demonstration    Comprehension  Verbalized understanding;Returned demonstration       PT Short Term Goals - 04/03/18 1846      PT SHORT TERM GOAL #1   Title  Patient will report 50% or greater improvement in her symptoms of dizziness in order to have improved quality of life.     Time  4    Period  Weeks    Status  New    Target Date  04/25/18        PT Long Term Goals - 04/03/18 1848      PT LONG TERM GOAL #1   Title  Patient will report 75% or greater improvement in her symptoms of dizziness and imbalance with provoking motions or positions by    Time  8    Period  Weeks    Status  New    Target Date  05/23/18      PT LONG TERM GOAL #2   Title  Patient will be able to perform home exercise program with assistance of a staff member or family member for self management.     Time  8    Period  Weeks    Status  New    Target Date  05/23/18            Plan - 04/04/18 1359    Clinical Impression Statement  Patient presented today with complaints of dizziness. Patient's blood pressure was 130/51 mmHg. Discussed low blood pressure can contribute to dizziness symptoms and encouraged patient to stay well hydrated.  Patient's daughter reports that she and staff members having been doing HEP with patient daily, but reports incorrect technique with VOR x 1. Reviewed proper technique and added additional exercise to home exercise program. Patient is wheelchair bound and has macular degeneration which impacts treatment exercise options. Will continue to try to advance exercises as able. Will plan on reviewing home exercise program next session. Patient would benefit from continued PT services to further address patient's symptoms.     Rehab Potential  Fair    PT Frequency  1x / week    PT Duration  8 weeks    PT Treatment/Interventions  Vestibular;Canalith Repostioning;Therapeutic exercise;Neuromuscular re-education;Patient/family education;Balance training;Therapeutic activities    PT Next Visit Plan  review HEP, progress exercises as able, try EC with head turns     PT Home Exercise Plan  seated VOR x 1 horizontal 1 min reps, seated ball follows and ball tosses with horizontal and vertical head turns, seated ball circles with head/eye follows    Consulted and Agree with Plan of Care  Patient;Family member/caregiver       Patient will benefit from skilled therapeutic intervention in order to improve the following deficits and impairments:  Decreased balance, Decreased coordination, Decreased mobility, Dizziness  Visit Diagnosis: Dizziness and giddiness     Problem List Patient Active Problem List   Diagnosis Date Noted  . TIA (transient ischemic attack) 10/23/2016  . Carotid artery occlusion with infarction (HCC) 02/26/2016  . CVA (cerebral vascular accident) (HCC) 02/26/2016  . Essential hypertension 02/26/2016  . Hyperlipidemia 02/26/2016  . Benign paroxysmal positional vertigo 02/26/2016  . Altered mental status 04/30/2015   Mardelle Matte PT, DPT [phone] Mardelle Matte 04/05/2018, 9:26 AM  [place] [facility] MAIN [facility] SERVICES [address] [place], [place],  [address] Phone: [phone]   Fax:  [phone]  Name: Denise Calderon MRN: [id number] Date of Birth: [birth year]

## 2018-04-11 ENCOUNTER — Ambulatory Visit: Payer: Medicare Other | Admitting: Physical Therapy

## 2018-04-11 ENCOUNTER — Encounter: Payer: Self-pay | Admitting: Physical Therapy

## 2018-04-11 DIAGNOSIS — R42 Dizziness and giddiness: Secondary | ICD-10-CM

## 2018-04-11 NOTE — Therapy (Signed)
[place] [facility] MAIN [facility] SERVICES [address] [place], [place], [address] Phone: [phone]   Fax:  [phone]  Physical Therapy Treatment  Patient Details  Name: Denise Calderon MRN: [id number] Date of Birth: 10-18-1923 Referring Provider (PT): Dr. Linus Salmons   Encounter Date: 04/11/2018  PT End of Session - 04/20/18 1345    Visit Number  3    Number of Visits  9    Date for PT Re-Evaluation  05/23/18    PT Start Time  1353    PT Stop Time  1442    PT Time Calculation (min)  49 min    Activity Tolerance  Patient tolerated treatment well    Behavior During Therapy  [facility] for tasks assessed/performed       Past Medical History:  Diagnosis Date  . Anemia   . Aortic valve defect    Aortic valve replaced  . Hypertension   . Pacemaker   . Stroke (HCC)   . Vertigo     Past Surgical History:  Procedure Laterality Date  . aortic valve replaced    . CAROTID STENT Left   . CTR Left   . HIP FRACTURE SURGERY Right   . PACEMAKER LEAD REMOVAL N/A 10/29/2015   Procedure: PACEMAKER generator change out;  Surgeon: Marcina Millard, MD;  Location: [facility] ORS;  Service: Cardiovascular;  Laterality: N/A;    There were no vitals filed for this visit.  Subjective Assessment - 04/20/18 1347    Subjective  Patient and patient's daughter reports that the exercises are helping reduce the dizziness symptoms. Patient reports that she had one episode of dizziness this past week.     Patient is accompained by:  Family member   daughter   Pertinent History  This is a 84 year old female that presents with complaints of dizziness and intermittent vertigo. History provided largely by patient's daughter as well as the patient. Patient had her first stroke 12 years ago and she lost strength in her right UE and LE at that time. 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 Patient  also had a pacemaker placed in 2001. Patient was in a coma for 3 weeks after the cardiac surgery. Patient used a HW after the first stroke and she fell and broke her hip in 2014 and had hip surgery. Patient was unable to walk after the hip surgery. Patient's daughter reports that patient may have had TIAs after the first stroke as well. Patient started having dizzy spells a few years ago but reports it has worsened over the past year and patient unable to specifically identify how long this has been occurring. Patient reports vertigo; she is not sure if she has been having vertigo for years or just in the past few months. Patient reports the vertigo lasts hours sometimes. Patient reports lying down and closing her eyes, and meclizine helps reduce her symptoms. Patient unable to report anything that brings on the dizziness. Patient's daughter reports that her mother has some anxiety at times. Patient will keep her eyes closed and she will not respond during episodes of dizziness and she gets very weak. Patient's daughter states the patient gets really weak and will not eat anything and gets confused after an episode of vertigo. No history of seizures per patient, family report and per medical record that the patient has been diagnosed with. Patient's daughter reports  that the patient has been having episodes of vertigo spells. Patient's dizziness has gotten worse recently.     Patient Stated Goals  to have decreased dizziness    Currently in Pain?  No/denies        Neuromuscular Re-education:  VOR X 1 exercise: Patient performed VOR X 1 horizontal in sitting 3 reps of 1 minute each with conflicting background with verbal cues for technique.  Patient reports she is having more dizziness today than she has had in a long time. Patient reports "large" amount of dizziness with this exercise.  Blood pressure was 128/61 mmHg  Demonstrated head eye follows while tracking her thumb side horizontal and vertical  needed verbal cues to continue to follow her thumb target the whole time as she tends to stop at times.         PT Education - 04/11/18 1353    Education Reviewed home exercise program; discussed plan of care   Person(s) Educated  Patient    Methods  Explanation    Comprehension  Verbalized understanding       PT Short Term Goals - 04/03/18 1846      PT SHORT TERM GOAL #1   Title  Patient will report 50% or greater improvement in her symptoms of dizziness in order to have improved quality of life.     Time  4    Period  Weeks    Status  New    Target Date  04/25/18        PT Long Term Goals - 04/03/18 1848      PT LONG TERM GOAL #1   Title  Patient will report 75% or greater improvement in her symptoms of dizziness and imbalance with provoking motions or positions by    Time  8    Period  Weeks    Status  New    Target Date  05/23/18      PT LONG TERM GOAL #2   Title  Patient will be able to perform home exercise program with assistance of a staff member or family member for self management.     Time  8    Period  Weeks    Status  New    Target Date  05/23/18            Plan - 04/11/18 1353    Rehab Potential  Fair    PT Frequency  1x / week    PT Duration  8 weeks    PT Treatment/Interventions  Vestibular;Canalith Repostioning;Therapeutic exercise;Neuromuscular re-education;Patient/family education;Balance training;Therapeutic activities    PT Next Visit Plan  review HEP, progress exercises as able, try EC with head turns     PT Home Exercise Plan  seated VOR x 1 horizontal 1 min reps, seated ball follows and ball tosses with horizontal and vertical head turns, seated ball circles with head/eye follows    Consulted and Agree with Plan of Care  Patient;Family member/caregiver       Patient will benefit from skilled therapeutic intervention in order to improve the following deficits and impairments:  Decreased balance, Decreased coordination, Decreased  mobility, Dizziness  Visit Diagnosis: Dizziness and giddiness     Problem List Patient Active Problem List   Diagnosis Date Noted  . TIA (transient ischemic attack) 10/23/2016  . Carotid artery occlusion with infarction (HCC) 02/26/2016  . CVA (cerebral vascular accident) (HCC) 02/26/2016  . Essential hypertension 02/26/2016  . Hyperlipidemia 02/26/2016  . Benign paroxysmal positional vertigo 02/26/2016  .  Altered mental status 04/30/2015   Mardelle Matte PT, DPT [phone] Mardelle Matte 04/11/2018, 1:54 PM  [place] [facility] MAIN [facility] SERVICES [address] [place], [place], [address] Phone: [phone]   Fax:  [phone]  Name: Denise Calderon MRN: [id number] Date of Birth: 01/22/1924

## 2018-04-18 ENCOUNTER — Encounter: Payer: Medicare Other | Admitting: Physical Therapy

## 2018-04-20 ENCOUNTER — Encounter: Payer: Self-pay | Admitting: Physical Therapy

## 2018-04-20 ENCOUNTER — Ambulatory Visit: Payer: Medicare Other | Attending: Unknown Physician Specialty | Admitting: Physical Therapy

## 2018-04-20 VITALS — BP 115/42

## 2018-04-20 DIAGNOSIS — R42 Dizziness and giddiness: Secondary | ICD-10-CM | POA: Diagnosis not present

## 2018-04-20 NOTE — Therapy (Signed)
[place] MAIN [facility] SERVICES [address] [place], [place], [address] Phone: [phone]   Fax:  [phone]  Physical Therapy Treatment/Discharge Summary  Patient Details  Name: Denise Calderon MRN: [id number] Date of Birth: 05-Feb-1924 Referring Provider (PT): Dr. Beverly Gust   Encounter Date: 04/20/2018                Patient has been seen for 4 visits from 03/28/2018 to 04/20/2018.  PT End of Session - 04/20/18 1352    Visit Number  4    Number of Visits  9    Date for PT Re-Evaluation  05/23/18    PT Start Time  7062    PT Stop Time  1450    PT Time Calculation (min)  57 min    Activity Tolerance  Patient tolerated treatment well    Behavior During Therapy  [facility] for tasks assessed/performed       Past Medical History:  Diagnosis Date  . Anemia   . Aortic valve defect    Aortic valve replaced  . Hypertension   . Pacemaker   . Stroke ([place])   . Vertigo     Past Surgical History:  Procedure Laterality Date  . aortic valve replaced    . CAROTID STENT Left   . CTR Left   . HIP FRACTURE SURGERY Right   . PACEMAKER LEAD REMOVAL N/A 10/29/2015   Procedure: PACEMAKER generator change out;  Surgeon: Isaias Cowman, MD;  Location: [facility] ORS;  Service: Cardiovascular;  Laterality: N/A;    Vitals:   04/20/18 1430  BP: (!) 115/42    Subjective Assessment - 04/20/18 1352    Subjective  Patient states she feels pretty good and states no dizziness today. Patient states she has had some light dizziness in the past week. Patient's daughter says that the patient has not had any episodes that have been as severe as before or have lasted as long as they did before she started the vestibular therapy.     Patient is accompained by:  Family member   daughter   Pertinent History  This is a 84 year old female that presents with complaints of dizziness and intermittent vertigo. History provided largely by patient's daughter as well as the  patient. Patient had her first stroke 12 years ago and she lost strength in her right UE and LE at that time. 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 Patient also had a pacemaker placed in 2001. Patient was in a coma for 3 weeks after the cardiac surgery. Patient used a HW after the first stroke and she fell and broke her hip in 2014 and had hip surgery. Patient was unable to walk after the hip surgery. Patient's daughter reports that patient may have had TIAs after the first stroke as well. Patient started having dizzy spells a few years ago but reports it has worsened over the past year and patient unable to specifically identify how long this has been occurring. Patient reports vertigo; she is not sure if she has been having vertigo for years or just in the past few months. Patient reports the vertigo lasts hours sometimes. Patient reports lying down and closing her eyes, and meclizine helps reduce her symptoms. Patient unable to report anything that brings on the dizziness. Patient's daughter reports that her mother has some anxiety at times. Patient will keep her  eyes closed and she will not respond during episodes of dizziness and she gets very weak. Patient's daughter states the patient gets really weak and will not eat anything and gets confused after an episode of vertigo. No history of seizures per patient, family report and per medical record that the patient has been diagnosed with. Patient's daughter reports that the patient has been having episodes of vertigo spells. Patient's dizziness has gotten worse recently.     Patient Stated Goals  to have decreased dizziness    Currently in Pain?  No/denies       Patient's daughter reports she does the exercise program every day with patient when she is there visiting. Patient and her daughter report that the patient has not had any more episodes of lying in bed for 2 or 3  days due to dizziness episodes anymore. Patient's daughter reports that patient is taking much less meclizine and reports patient has not had to go back to bed due to dizziness since starting therapy.   Neuromuscular Re-education: Patient's daughter demonstrated home exercise program with patient.  VOR X 1 exercise: Patient's daughter instructed patient to turn her head as far as she could side to side and to do it as fast as she can.  Cued patient and her daughter that the head movement needs to be smaller in amplitude about 30 degrees each side and to perform as quickly as patient can without the target getting blurry or jumpy. Patient able to perform correctly after this cue without any further cuing.  Performed 30 seconds, 50 seconds, and then 50 second each of VOR X 1 horizontal in sitting.  Patient reports she gets a little bit of dizziness when doing but states if she does "a whole lot" of the exercise the dizziness increases.   Ball Follows: Patient's daughter held ball for patient while doing horizontal and then vertical ball follows. Required one cue to bring ball further away from the patient's body so the ball is easier for the patient to see.  Patient demonstrates good speed with horizontal ball follows and was able to keep her eyes focused on the target the majority of the time.  Patient did need verbal cues to turn her head when doing vertical ball follows as she at times was just following target with her eyes.  Patient did ball follow circles and patient's daughter cued patient to keep her eyes on the ball and patient better able to follow ball with head and eyes.  Wrote instructions of the patient's home exercise program at the top to remind whoever is helping patient with her exercises to have patient follow the target (ball) with her head and her eyes. Patient also demonstrated that she uses her left thumb as a target for doing horizontal and vertical head and eye  follows.  Patient's blood pressure at rest was 115/42 mmHg.  Discussed progress towards goals and discharge plans. Patient and her daughter are in agreement with discharge at this time. Patient plans to continue to do her home exercise program daily with assistance of her daughter or a staff member where she lives. In addition, patient able to do some of the head and eye follows by using her left thumb as a target.    PT Education - 04/20/18 1352  Education Details  Discussed progress towards goals and discharge plans; reviewed home exercise program and plans for continuing to perform HEP upon discharge.   Person(s) Educated  Patient;Child(ren)   dtr   Methods  Explanation;Verbal cues    Comprehension  Verbalized understanding; Demonstrated understanding      PT Short Term Goals - 04/20/18 1425      PT SHORT TERM GOAL #1   Title  Patient will report 50% or greater improvement in her symptoms of dizziness in order to have improved quality of life.     Baseline  patient reports 50% improvement since starting therapy.     Time  4    Period  Weeks    Status  Achieved        PT Long Term Goals - 05/04/18 0813      PT LONG TERM GOAL #1   Title  Patient will report 75% or greater improvement in her symptoms of dizziness and imbalance with provoking motions or positions by    Time  8    Period  Weeks    Status  Partially Met      PT LONG TERM GOAL #2   Title  Patient will be able to perform home exercise program with assistance of a staff member or family member for self management.     Time  8    Period  Weeks    Status  Achieved            Plan - 05/04/18 6269    Clinical Impression Statement  Patient has met two goals and partially met remaining goals as set on plan of care. Patient reports 50% improvement in her overall symptoms of dizziness since initiating vestibular therapy. Discussed progress towards goals and discharge plans.  Patient and her daughter are in agreement with discharge at this time. Patient plans to continue to do her home exercise program daily with assistance of her daughter or a staff member where she lives. In addition, patient able to do some of the head and eye follows by using her left thumb as a target. Patient continues to have low blood pressure when taken during therapy sessions and encouraged patient her daughter to follow up with her physician in regards to this matter as it can be contributing to patient's dizziness symptoms. Encouraged patient to try to stay well hydrated. Suggested having staff check blood pressure when patient is having episode of dizziness to see if it is low to continue to monitor and encouraging patient to stay well hydrated. Patient's daughter reports that the patient gets very drowsy when she takes the Meclizine. Encouraged patient and her daughter to discuss this with her physician as maybe the dosage could be adjusted or other measures per physician. Patient and her daughter are in agreement with discharge from vestibular therapy at this time with plan for patient to continue to perform home exercise program daily with assistance of her daughter and/or staff members at the patient's living facility.     Rehab Potential  Fair    PT Frequency  1x / week    PT Duration  8 weeks    PT Treatment/Interventions  Vestibular;Canalith Repostioning;Therapeutic exercise;Neuromuscular re-education;Patient/family education;Balance training;Therapeutic activities    PT Next Visit Plan  --    PT Home Exercise Plan  seated VOR x 1 horizontal 1 min reps, seated ball follows and ball tosses with horizontal and vertical head turns, seated ball circles with head/eye follows    Consulted and Agree with Plan  of Care  Patient;Family member/caregiver       Patient will benefit from skilled therapeutic intervention in order to improve the following deficits and impairments:  Decreased balance,  Decreased coordination, Decreased mobility, Dizziness  Visit Diagnosis: Dizziness and giddiness     Problem List Patient Active Problem List   Diagnosis Date Noted  . TIA (transient ischemic attack) 10/23/2016  . Carotid artery occlusion with infarction ([place]) 02/26/2016  . CVA (cerebral vascular accident) ([place]) 02/26/2016  . Essential hypertension 02/26/2016  . Hyperlipidemia 02/26/2016  . Benign paroxysmal positional vertigo 02/26/2016  . Altered mental status 04/30/2015   Lady Deutscher PT, DPT [phone]  Lady Deutscher 04/20/2018, 3:00 PM  [place] MAIN [facility] SERVICES [address] [place], [place], [address] Phone: [phone]   Fax:  [phone]  Name: Denise Calderon MRN: [id number] Date of Birth: [birth year]

## 2018-04-25 ENCOUNTER — Encounter: Payer: Medicare Other | Admitting: Physical Therapy

## 2018-05-02 ENCOUNTER — Encounter: Payer: Medicare Other | Admitting: Physical Therapy

## 2018-05-09 ENCOUNTER — Encounter: Payer: Medicare Other | Admitting: Physical Therapy

## 2018-11-30 ENCOUNTER — Emergency Department: Payer: Medicare Other

## 2018-11-30 ENCOUNTER — Encounter: Payer: Self-pay | Admitting: Emergency Medicine

## 2018-11-30 ENCOUNTER — Observation Stay
Admission: EM | Admit: 2018-11-30 | Discharge: 2018-12-02 | Disposition: A | Discharge status: Skilled Nursing Facility | Payer: Medicare Other | Attending: Surgery | Admitting: Surgery

## 2018-11-30 ENCOUNTER — Other Ambulatory Visit: Payer: Self-pay

## 2018-11-30 DIAGNOSIS — I2510 Atherosclerotic heart disease of native coronary artery without angina pectoris: Secondary | ICD-10-CM | POA: Insufficient documentation

## 2018-11-30 DIAGNOSIS — K449 Diaphragmatic hernia without obstruction or gangrene: Secondary | ICD-10-CM | POA: Insufficient documentation

## 2018-11-30 DIAGNOSIS — Z1159 Encounter for screening for other viral diseases: Secondary | ICD-10-CM | POA: Insufficient documentation

## 2018-11-30 DIAGNOSIS — Z7982 Long term (current) use of aspirin: Secondary | ICD-10-CM | POA: Insufficient documentation

## 2018-11-30 DIAGNOSIS — Z7902 Long term (current) use of antithrombotics/antiplatelets: Secondary | ICD-10-CM | POA: Insufficient documentation

## 2018-11-30 DIAGNOSIS — L8990 Pressure ulcer of unspecified site, unspecified stage: Secondary | ICD-10-CM | POA: Insufficient documentation

## 2018-11-30 DIAGNOSIS — K8020 Calculus of gallbladder without cholecystitis without obstruction: Secondary | ICD-10-CM | POA: Diagnosis present

## 2018-11-30 DIAGNOSIS — K5730 Diverticulosis of large intestine without perforation or abscess without bleeding: Secondary | ICD-10-CM | POA: Insufficient documentation

## 2018-11-30 DIAGNOSIS — Z791 Long term (current) use of non-steroidal anti-inflammatories (NSAID): Secondary | ICD-10-CM | POA: Insufficient documentation

## 2018-11-30 DIAGNOSIS — Z87891 Personal history of nicotine dependence: Secondary | ICD-10-CM | POA: Insufficient documentation

## 2018-11-30 DIAGNOSIS — Z7951 Long term (current) use of inhaled steroids: Secondary | ICD-10-CM | POA: Diagnosis not present

## 2018-11-30 DIAGNOSIS — Z8673 Personal history of transient ischemic attack (TIA), and cerebral infarction without residual deficits: Secondary | ICD-10-CM | POA: Insufficient documentation

## 2018-11-30 DIAGNOSIS — Z950 Presence of cardiac pacemaker: Secondary | ICD-10-CM | POA: Insufficient documentation

## 2018-11-30 DIAGNOSIS — I700 Atherosclerosis of aorta: Secondary | ICD-10-CM | POA: Diagnosis not present

## 2018-11-30 DIAGNOSIS — I10 Essential (primary) hypertension: Secondary | ICD-10-CM | POA: Diagnosis not present

## 2018-11-30 DIAGNOSIS — Z952 Presence of prosthetic heart valve: Secondary | ICD-10-CM | POA: Diagnosis not present

## 2018-11-30 DIAGNOSIS — R109 Unspecified abdominal pain: Secondary | ICD-10-CM

## 2018-11-30 DIAGNOSIS — E785 Hyperlipidemia, unspecified: Secondary | ICD-10-CM | POA: Diagnosis not present

## 2018-11-30 DIAGNOSIS — Z8249 Family history of ischemic heart disease and other diseases of the circulatory system: Secondary | ICD-10-CM | POA: Insufficient documentation

## 2018-11-30 DIAGNOSIS — N210 Calculus in bladder: Secondary | ICD-10-CM | POA: Insufficient documentation

## 2018-11-30 DIAGNOSIS — Z79899 Other long term (current) drug therapy: Secondary | ICD-10-CM | POA: Insufficient documentation

## 2018-11-30 LAB — COMPREHENSIVE METABOLIC PANEL
ALT: 14 U/L (ref 0–44)
AST: 22 U/L (ref 15–41)
Albumin: 3.8 g/dL (ref 3.5–5.0)
Alkaline Phosphatase: 82 U/L (ref 38–126)
Anion gap: 8 (ref 5–15)
BUN: 30 mg/dL — ABNORMAL HIGH (ref 8–23)
CO2: 28 mmol/L (ref 22–32)
Calcium: 9.4 mg/dL (ref 8.9–10.3)
Chloride: 104 mmol/L (ref 98–111)
Creatinine, Ser: 0.77 mg/dL (ref 0.44–1.00)
GFR calc Af Amer: >60 mL/min (ref >60)
GFR calc non Af Amer: >60 mL/min (ref >60)
Glucose, Bld: 149 mg/dL — ABNORMAL HIGH (ref 70–99)
Potassium: 3.9 mmol/L (ref 3.5–5.1)
Sodium: 140 mmol/L (ref 135–145)
Total Bilirubin: 0.7 mg/dL (ref 0.3–1.2)
Total Protein: 7.4 g/dL (ref 6.5–8.1)

## 2018-11-30 LAB — CBC
HCT: 39.7 % (ref 36.0–46.0)
Hemoglobin: 12.6 g/dL (ref 12.0–15.0)
MCH: 31.7 pg (ref 26.0–34.0)
MCHC: 31.7 g/dL (ref 30.0–36.0)
MCV: 99.7 fL (ref 80.0–100.0)
Platelets: 241 10*3/uL (ref 150–400)
RBC: 3.98 MIL/uL (ref 3.87–5.11)
RDW: 13.7 % (ref 11.5–15.5)
WBC: 10.4 10*3/uL (ref 4.0–10.5)
nRBC: 0 % (ref 0.0–0.2)

## 2018-11-30 LAB — LIPASE, BLOOD: Lipase: 34 U/L (ref 11–51)

## 2018-11-30 LAB — TROPONIN I (HIGH SENSITIVITY)
Troponin I (High Sensitivity): 10 ng/L (ref <18)
Troponin I (High Sensitivity): 10 ng/L (ref <18)

## 2018-11-30 MED ORDER — IOHEXOL 300 MG/ML  SOLN
75.0000 mL | Freq: Once | INTRAMUSCULAR | Status: AC | PRN
  Administered 2018-11-30: 19:00:00 75 mL via INTRAVENOUS

## 2018-11-30 MED ORDER — SODIUM CHLORIDE 0.9% FLUSH: 3.0000 mL | Freq: Once | INTRAVENOUS | Status: DC

## 2018-11-30 NOTE — ED Notes (Signed)
ED Provider Archie Balboa on the phone with daughter POC providing a status update.

## 2018-11-30 NOTE — ED Notes (Signed)
Pt returned to ED Rm 8 from CT at this time. 

## 2018-11-30 NOTE — ED Notes (Signed)
This RN spoke with lab tech, Mohammed, per lab tech "having problems" "in process" but results will show within "16minutes".

## 2018-11-30 NOTE — ED Notes (Signed)
ED Provider Goodman at bedside. 

## 2018-11-30 NOTE — H&P (Signed)
Subjective:   CC: Chest pain  HPI:  Denise Calderon is a 85 y.o. female who is consulted by Derrill KayGoodman for evaluation of above cc.  History obtained from daughter over the phone due to patient's inability to give history.  Patient daughter reports that patient has been complaining of some chest pain over her sternum and heart area for the past couple weeks.  Earlier today, she was told by the nursing home staff that the patient was complaining of extreme pain while attempting to eat.  Unclear where the pain location was.  Otherwise does not report any issues of nausea vomiting or any other GI symptoms.  She does state that the patient has been losing weight recently.  Concern is that she is unable to fully take in her meals, due to her previous history of stroke.  And not getting adequate help at her nursing home.  Per the ED provider's report, he was told that the patient was complaining of right low rib pain.  Patient's daughter however specifically denied that and stated to me that her mother was acting like she was having pain more in her sternum by placing her hand over her chest and having a grimace on her face.  Daughter does report that her mentation does wax and wane, and it is not uncommon for her to be unable to answer questions appropriately  Past Medical History:  has a past medical history of Anemia, Aortic valve defect, Hypertension, Pacemaker, Stroke (HCC), and Vertigo.  Past Surgical History:  has a past surgical history that includes aortic valve replaced; CTR (Left); Carotid stent (Left); Hip fracture surgery (Right); and Pacemaker lead removal (N/A, 10/29/2015).  Family History: family history includes Heart attack in her mother.  Social History:  reports that she quit smoking about 45 years ago. She has never used smokeless tobacco. She reports that she does not drink alcohol or use drugs.  Current Medications:  acetaminophen (MAPAP) 500 MG tablet Take 1,000 mg by mouth 3  (three) times daily. Pt is also able to take every six hours as needed for pain. Tonna BoehringerSakai, Shima Compere, DO Reordered  Ordered as: acetaminophen (TYLENOL) tablet 1,000 mg - 1,000 mg, Oral, 3 times daily, First dose on Thu 11/30/18 at 2230  amLODipine (NORVASC) 5 MG tablet Take 1 tablet (5 mg total) by mouth daily. Tonna BoehringerSakai, Basma Buchner, DO Reordered  Ordered as: amLODipine (NORVASC) tablet 5 mg - 5 mg, Oral, Daily, First dose on Fri 12/01/18 at 1000  aspirin EC 81 MG EC tablet Take 1 tablet (81 mg total) by mouth daily. Sung AmabileSakai, Shantea Poulton, DO Not Ordered   Patient not taking: Reported on 03/28/2018    atorvastatin (LIPITOR) 40 MG tablet Take 1 tablet (40 mg total) by mouth daily at 6 PM. Sung AmabileSakai, Shunda Rabadi, DO Not Ordered   Patient not taking: Reported on 03/28/2018    Calcium Carbonate-Vitamin D (CALCIUM-D) 600-400 MG-UNIT TABS Take 1 tablet by mouth 2 (two) times daily with a meal.  Tonna BoehringerSakai, Edgardo Petrenko, DO Reordered  Ordered as: Calcium-D 600-400 MG-UNIT TABS 1 tablet - 1 tablet, Oral, 2 times daily with meals, First dose on Fri 12/01/18 at 0800  carboxymethylcellulose (REFRESH PLUS) 0.5 % SOLN 1 drop 3 (three) times daily as needed. Tonna BoehringerSakai, Jourdon Zimmerle, DO Not Ordered  citalopram (CELEXA) 20 MG tablet Take 20 mg by mouth daily. Tonna BoehringerSakai, Asna Muldrow, DO Reordered  Ordered as: citalopram (CELEXA) tablet 20 mg - 20 mg, Oral, Daily, First dose on Fri 12/01/18 at 1000  clopidogrel (PLAVIX) 75 MG  tablet Take 75 mg by mouth daily. Lysle Pearl, Yasmene Salomone, DO Not Ordered  diclofenac sodium (VOLTAREN) 1 % GEL Apply topically 4 (four) times daily as needed. Lysle Pearl, Juhi Lagrange, DO Not Ordered  fluticasone (FLONASE) 50 MCG/ACT nasal spray Place 2 sprays into both nostrils daily.  Lysle Pearl, Simora Dingee, DO Reordered  Ordered as: fluticasone (FLONASE) 50 MCG/ACT nasal spray 2 spray - 2 spray, Each Nare, Daily, First dose on Fri 12/01/18 at 1000  HYDROcodone-acetaminophen (NORCO/VICODIN) 5-325 MG tablet Take 1 tablet by mouth every 6 (six) hours as needed for moderate pain.  Benjamine Sprague, DO  Not Ordered  meclizine (ANTIVERT) 25 MG tablet Take 25 mg by mouth 3 (three) times daily as needed for dizziness. Benjamine Sprague, DO Reordered  Ordered as: meclizine (ANTIVERT) tablet 25 mg - 25 mg, Oral, 3 times daily PRN, dizziness, Starting Thu 11/30/18 at 2220  mirtazapine (REMERON) 15 MG tablet Take 15 mg by mouth at bedtime. Lysle Pearl, Khia Dieterich, DO Reordered  Ordered as: mirtazapine (REMERON) tablet 15 mg - 15 mg, Oral, Daily at bedtime, First dose on Thu 11/30/18 at 2230  Multiple Vitamins-Minerals (ICAPS AREDS FORMULA PO) Take 1 tablet by mouth 2 (two) times daily. Lysle Pearl, Salwa Bai, DO Not Ordered  omeprazole (PRILOSEC) 40 MG capsule Take 40 mg by mouth daily. Lysle Pearl, Laken Lobato, DO Not Ordered  ondansetron (ZOFRAN-ODT) 4 MG disintegrating tablet Take 4 mg by mouth every 6 (six) hours as needed for nausea or vomiting. Lysle Pearl, Dyamon Sosinski, DO Reordered  Ordered as: ondansetron (ZOFRAN-ODT) disintegrating tablet 4 mg - 4 mg, Oral, Every 6 hours PRN, nausea, vomiting, Starting Thu 11/30/18 at 2220  oxybutynin (DITROPAN-XL) 10 MG 24 hr tablet Take 10 mg by mouth daily. Lysle Pearl, Yakira Duquette, DO Reordered  Ordered as: oxybutynin (DITROPAN-XL) 24 hr tablet 10 mg - 10 mg, Oral, Daily, First dose on Fri 12/01/18 at 1000  pantoprazole (PROTONIX) 40 MG tablet Take 40 mg by mouth daily. Lysle Pearl, Damon Baisch, DO Reordered  Ordered as: pantoprazole (PROTONIX) EC tablet 40 mg - 40 mg, Oral, Daily, First dose on Fri 12/01/18 at 1000     Allergies:  Allergies as of 11/30/2018  . (No Known Allergies)    ROS:  Unable to obtain secondary to patient mental status     Objective:     BP (!) 160/56   Pulse 71   Temp 98 F (36.7 C) (Oral)   Resp (!) 21   SpO2 94%    Constitutional :  no distress and Unable to answer questions  Lymphatics/Throat:  no asymmetry, masses, or scars  Respiratory:  clear to auscultation bilaterally  Cardiovascular:  regular rate and rhythm  Gastrointestinal: soft, non-tender; bowel sounds normal; no masses,  no  organomegaly.   Skin: Cool and moist, no obvious wounds visible during this portion of the exam  Psychiatric: Normal affect, non-agitated, not confused       LABS:  CMP Latest Ref Rng & Units 11/30/2018 10/23/2016 08/04/2015  Glucose 70 - 99 mg/dL 149(H) 84 90  BUN 8 - 23 mg/dL 30(H) 12 16  Creatinine 0.44 - 1.00 mg/dL 0.77 0.63 0.54  Sodium 135 - 145 mmol/L 140 138 134(L)  Potassium 3.5 - 5.1 mmol/L 3.9 3.7 3.9  Chloride 98 - 111 mmol/L 104 100(L) 100(L)  CO2 22 - 32 mmol/L 28 29 26   Calcium 8.9 - 10.3 mg/dL 9.4 9.0 8.9  Total Protein 6.5 - 8.1 g/dL 7.4 7.2 -  Total Bilirubin 0.3 - 1.2 mg/dL 0.7 0.7 -  Alkaline Phos 38 - 126 U/L 82  81 -  AST 15 - 41 U/L 22 25 -  ALT 0 - 44 U/L 14 12(L) -   CBC Latest Ref Rng & Units 11/30/2018 10/23/2016 08/04/2015  WBC 4.0 - 10.5 K/uL 10.4 10.0 5.9  Hemoglobin 12.0 - 15.0 g/dL 16.112.6 09.613.4 04.512.8  Hematocrit 36.0 - 46.0 % 39.7 39.0 37.8  Platelets 150 - 400 K/uL 241 215 203     RADS: ADDENDUM REPORT: 11/30/2018 19:56  ADDENDUM: These results were called by telephone at the time of interpretation on 11/30/2018 at 7:56 pm to Dr. Phineas SemenGRAYDON GOODMAN , who verbally acknowledged these results.   Electronically Signed   By: Norva PavlovElizabeth  Brown M.D.   On: 11/30/2018 19:56   Addended by Norva PavlovBrown, Elizabeth, MD on 11/30/2018 8:00 PM    ADDENDUM REPORT: 11/30/2018 19:50  ADDENDUM: Contracted, thick-walled gallbladder suspicious for acute cholecystitis. Recommend further evaluation with abdominal ultrasound.   Electronically Signed   By: Norva PavlovElizabeth  Brown M.D.   On: 11/30/2018 19:50   Addended by Norva PavlovBrown, Elizabeth, MD on 11/30/2018 7:56 PM    Study Result  CLINICAL DATA:  [facility], with c/o upper abd pain today. Per EMS pt guarding LUQ abd.  EXAM: CT ABDOMEN AND PELVIS WITH CONTRAST  TECHNIQUE: Multidetector CT imaging of the abdomen and pelvis was performed using the standard protocol following bolus  administration of intravenous contrast.  CONTRAST:  75mL OMNIPAQUE IOHEXOL 300 MG/ML  SOLN  COMPARISON:  None.  FINDINGS: Lower chest: There is bibasilar atelectasis. The heart is enlarged. There are extensive coronary artery calcifications and calcifications of the aortic valve and mitral annulus. Pacemaker leads to the RIGHT atrium and coronary sinus.  Hepatobiliary: Common bile duct is dilated, 1 point 2 centimeters. There is intrahepatic biliary duct dilatation, primarily involving the LEFT hepatic lobe. Gallbladder is contracted, with thickened and enhancing wall. No pericholecystic fluid. No calcified stones are identified.  Pancreas: Unremarkable. No pancreatic ductal dilatation or surrounding inflammatory changes.  Spleen: Normal in size without focal abnormality.  Adrenals/Urinary Tract: Adrenal glands are normal.  No renal mass. Note is made of extrarenal pelvis on the RIGHT. Within the RIGHT renal pelvis there is a dependent 3 millimeter calcification. No ureteral obstruction. Urinary bladder shows dependent layering high attenuation foci along the RIGHT posterior aspect suggesting layering bladder stones. The visualized urethra is unremarkable.  Stomach/Bowel: Small hiatal hernia. The stomach contains debris and is otherwise unremarkable. Small bowel loops are unremarkable. There is a moderate amount of stool throughout the colon. Numerous colonic diverticula are present. There is no acute diverticulitis. There is rectal wall thickening, a nonspecific and measuring 1.0 centimeters.  Normal appendix.  Vascular/Lymphatic: There is dense atherosclerotic calcification of the abdominal aorta not associated with aneurysm. No retroperitoneal or mesenteric adenopathy.  Reproductive: Uterus and bilateral adnexa are unremarkable.  Other: No abdominal wall hernia or abnormality. No abdominopelvic ascites.  Musculoskeletal: Atrophy of the RIGHT  iliopsoas muscles consistent with previous stroke. L1 compression fracture. Previous ORIF of the RIGHT hip.  IMPRESSION: 1. Small layering bladder stones, measuring 2-3 millimeters. Small stone identified within the dependent portion of the RIGHT renal pelvis. Findings are consistent with recently passed ureteral stones. There is no evidence for ureteral obstruction at this time. 2. Cardiomegaly and coronary artery disease. 3. Moderate stool burden. Nonspecific rectal wall thickening. Rectal lesion can not be excluded. Recommend correlation with physical exam. 4. Colonic diverticulosis. 5. Small hiatal hernia. 6. Atrophy of the RIGHT iliopsoas muscles consistent with previous  stroke. 7. Coronary artery disease. 8. Aortic Atherosclerosis (ICD10-I70.0).  Electronically Signed: By: Norva PavlovElizabeth  Brown M.D. On: 11/30/2018 19:45     CLINICAL DATA:  85 year old female with abdominal pain. Right upper quadrant pain for 1 week.  EXAM: ULTRASOUND ABDOMEN LIMITED RIGHT UPPER QUADRANT  COMPARISON:  CT Abdomen and Pelvis earlier today.  FINDINGS: Gallbladder:  Small 6 millimeter gallstone identified in the gallbladder neck on image 3. There does appear to be mild generalized gallbladder wall thickening of 3-4 millimeters (image 14). No pericholecystic fluid. No sonographic Murphy sign elicited.  Common bile duct:  Diameter: 7 millimeters, upper limits of normal to mildly increased. No filling defect identified within the visible duct (image 23).  Liver:  Mild central intrahepatic biliary ductal dilatation was more apparent by CT. Liver echogenicity within normal limits. No discrete liver lesion. Portal vein is patent on color Doppler imaging with normal direction of blood flow towards the liver.  Other findings: Negative visible right kidney.  IMPRESSION: 1. Positive for a 6 mm gallstone, and mild gallbladder wall thickening. However, no sonographic Murphy sign  elicited to strongly indicate acute cholecystitis. 2. Mild intra-and extrahepatic biliary ductal dilatation is seen by CT. No filling defect identified within the visible duct. 3. Overall, a Nuclear Medicine Hepatobiliary Scan may be most valuable at this point.   Electronically Signed   By: Odessa FlemingH  Hall M.D.   On: 11/30/2018 20:54  Assessment:      Chest pain/right lower rib pain Possible gallbladder wall thickening based on imaging studies concerning for cholecystitis History of CVA on Plavix History of AV block pacemaker placement Hypertension Hyperlipidemia  Plan:    Conflicting reports from daughter regarding pain location, unsure how much pain the patient is actually in.  ED provider reports no pain medications was needed during her entire stay.  On physical exam no indication that the patient is in any distress or discomfort during the abdominal exam, or palpation of her chest wall.  However, due to the report of extensive episodic pain earlier in the day, along with the findings noted on the CT and ultrasound, will place her in observation to ensure no specific pathology is found.  Due to the discrepancy between CT and ultrasound results regarding the gallbladder, we will proceed with a HIDA scan to see if we can get a more definitive answer regarding the gallbladder.  We will continue all her home medications for her chronic issues listed above except for the Plavix in preparation for possible surgery.  N.p.o. except sips with meds, constant turning to prevent pressure ulcers, IV fluids, HIDA scan will be ordered.  Patient is DNR, with copy of order noted at patient bedside.

## 2018-11-30 NOTE — ED Triage Notes (Signed)
Pt via EMS from the [facility], with c/o upper abd pain today. Per EMS pt guarding LUQ abd. PT A&Ox3, VSS . PT hx of stroke to RT side

## 2018-11-30 NOTE — ED Notes (Signed)
Ultrasound at bedside

## 2018-11-30 NOTE — ED Provider Notes (Signed)
[facility] Emergency Department Provider Note ____________________________________________   I have reviewed the triage vital signs and the nursing notes.   HISTORY  Chief Complaint Abdominal Pain   History limited by and level 5 caveat due to: Confusion  HPI Denise Calderon is a 85 y.o. female who presents to the emergency department today because of concern for upper abdominal and chest pain.  It appears that the symptoms started today.  The patient herself cannot give a great history.  Indicates pain in her upper abdomen.  No reported fevers.  Records reviewed. Per medical record review patient has a history of stroke, HTN.   Past Medical History:  Diagnosis Date  . Anemia   . Aortic valve defect    Aortic valve replaced  . Hypertension   . Pacemaker   . Stroke (HCC)   . Vertigo     Patient Active Problem List   Diagnosis Date Noted  . TIA (transient ischemic attack) 10/23/2016  . Carotid artery occlusion with infarction (HCC) 02/26/2016  . CVA (cerebral vascular accident) (HCC) 02/26/2016  . Essential hypertension 02/26/2016  . Hyperlipidemia 02/26/2016  . Benign paroxysmal positional vertigo 02/26/2016  . Altered mental status 04/30/2015    Past Surgical History:  Procedure Laterality Date  . aortic valve replaced    . CAROTID STENT Left   . CTR Left   . HIP FRACTURE SURGERY Right   . PACEMAKER LEAD REMOVAL N/A 10/29/2015   Procedure: PACEMAKER generator change out;  Surgeon: Marcina MillardAlexander Paraschos, MD;  Location: [facility] ORS;  Service: Cardiovascular;  Laterality: N/A;    Prior to Admission medications   Medication Sig Start Date End Date Taking? Authorizing Provider  acetaminophen (MAPAP) 500 MG tablet Take 1,000 mg by mouth 3 (three) times daily. Pt is also able to take every six hours as needed for pain.    [provider]  amLODipine (NORVASC) 5 MG tablet Take 1 tablet (5 mg total) by mouth daily. 10/24/16   Enid BaasKalisetti, Radhika,  MD  aspirin EC 81 MG EC tablet Take 1 tablet (81 mg total) by mouth daily. Patient not taking: Reported on 03/28/2018 10/25/16   Enid BaasKalisetti, Radhika, MD  atorvastatin (LIPITOR) 40 MG tablet Take 1 tablet (40 mg total) by mouth daily at 6 PM. Patient not taking: Reported on 03/28/2018 10/24/16   Enid BaasKalisetti, Radhika, MD  Calcium Carbonate-Vitamin D (CALCIUM-D) 600-400 MG-UNIT TABS Take 1 tablet by mouth 2 (two) times daily with a meal.     [provider]  carboxymethylcellulose (REFRESH PLUS) 0.5 % SOLN 1 drop 3 (three) times daily as needed.    [provider]  citalopram (CELEXA) 20 MG tablet Take 20 mg by mouth daily.    [provider]  clopidogrel (PLAVIX) 75 MG tablet Take 75 mg by mouth daily.    [provider]  diclofenac sodium (VOLTAREN) 1 % GEL Apply topically 4 (four) times daily as needed.    [provider]  fluticasone (FLONASE) 50 MCG/ACT nasal spray Place 2 sprays into both nostrils daily.     [provider]  HYDROcodone-acetaminophen (NORCO/VICODIN) 5-325 MG tablet Take 1 tablet by mouth every 6 (six) hours as needed for moderate pain.     [provider]  meclizine (ANTIVERT) 25 MG tablet Take 25 mg by mouth 3 (three) times daily as needed for dizziness.    [provider]  mirtazapine (REMERON) 15 MG tablet Take 15 mg by mouth at bedtime.    [provider]  Multiple Vitamins-Minerals (ICAPS AREDS FORMULA PO) Take 1 tablet by mouth 2 (two) times daily.    [provider]  omeprazole (PRILOSEC) 40 MG capsule Take 40 mg by mouth daily.    [provider]  ondansetron (ZOFRAN-ODT) 4 MG disintegrating tablet Take 4 mg by mouth every 6 (six) hours as needed for nausea or vomiting.    [provider]  oxybutynin (DITROPAN-XL) 10 MG 24 hr tablet Take 10 mg by mouth daily.    [provider]  pantoprazole (PROTONIX) 40 MG tablet Take 40 mg by mouth daily.    [provider]    Allergies Patient has no known allergies.  Family History  Problem Relation Age of Onset  . Heart attack Mother   . Diabetes Neg Hx   . COPD Neg Hx     Social History Social History   Tobacco Use  . Smoking status: Former Smoker    Quit date: 10/20/1973    Years since quitting: 45.1  . Smokeless tobacco: Never Used  Substance Use Topics  . Alcohol use: No  . Drug use: No    Review of Systems limited Constitutional: No fever/chills Cardiovascular: Positive for chest pain. Respiratory: Denies shortness of breath. Gastrointestinal: Positive for upper abdominal pain. Genitourinary: Negative for dysuria. Musculoskeletal: Negative for back pain. Neurological: Negative for headaches, focal weakness or numbness.  ____________________________________________   PHYSICAL EXAM:  VITAL SIGNS: ED Triage Vitals [11/30/18 1641]  Enc Vitals Group     BP 137/61     Pulse Rate 83     Resp 16     Temp 98 F (36.7 C)     Temp Source Oral     SpO2 95 %   Constitutional: Awake and alert, not completely oriented Eyes: Conjunctivae are normal.  ENT      Head: Normocephalic and atraumatic.      Nose: No congestion/rhinnorhea.      Mouth/Throat: Mucous membranes are moist.      Neck: No stridor. Hematological/Lymphatic/Immunilogical: No cervical lymphadenopathy. Cardiovascular: Normal rate, regular rhythm.  No murmurs, rubs, or gallops.  Respiratory: Normal respiratory effort without tachypnea nor retractions. Breath sounds are clear and equal bilaterally. No wheezes/rales/rhonchi. Gastrointestinal: Soft and minimally tender in the epigastric region. Genitourinary: Deferred Musculoskeletal: Normal range of motion in all extremities. No lower extremity edema. Neurologic: Awake and alert, not completely oriented, moving all extremities.  Sensation intact. Skin:  Skin is warm, dry and intact. No rash noted. ____________________________________________    LABS  (pertinent positives/negatives)  Trop hs 10 Lipase 34 CBC wbc 10.4, hgb 12.6, plt 241 CMP wnl except glu 149, bun 30  ____________________________________________   EKG  I, Phineas SemenGraydon Chinyere Galiano, attending physician, personally viewed and interpreted this EKG  EKG Time: 1645 Rate: 72 Rhythm: av dual paced rhythm Axis: left axis deviation Intervals: qtc 536 QRS: wide ST changes: no st elevation equivalent Impression: abnormal ekg   ____________________________________________    RADIOLOGY  CXR  Bronchitic changes  CT abd/pel Bladder stone. GB wall thickening  US RUQ 6mm stone in gallbladder neck ____________________________________________   PROCEDURES  Procedures  ____________________________________________   INITIAL IMPRESSION / ASSESSMENT AND PLAN / ED COURSE  Pertinent labs & imaging results that were available during my care of the patient were reviewed by me and considered in my medical decision making (see chart for details).   Patient presented to the emergency department today with concerns of abdominal and chest pain.  Broad work-up was initiated.  Troponin without concerning findings.  Chest x-ray without acute abnormality.  CT abdomen pelvis was obtained which is concerning for possible gallbladder wall thickening.  Ultrasound was then obtained which showed a 6 mm stone.  At this point I had a discussion with surgery.  I will plan on admission for further work-up and management.   ____________________________________________   FINAL CLINICAL IMPRESSION(S) / ED DIAGNOSES  Final diagnoses:  Abdominal pain  Gallstones     Note: This dictation was prepared with Dragon dictation. Any transcriptional errors that result from this process are unintentional     Nance Pear, MD 11/30/18 2225

## 2018-11-30 NOTE — ED Notes (Signed)
This RN called [facility] and spoke with Transport planner. Per RN pt is able to voice her needs A/Ox3. Per RN this afternoon pt c/o abd pain and "rib pain" when pt stood up from bed.

## 2018-11-30 NOTE — ED Notes (Signed)
MD Archie Balboa made aware.

## 2018-11-30 NOTE — ED Notes (Signed)
Patient transported to CT 

## 2018-11-30 NOTE — ED Notes (Signed)
ED TO INPATIENT HANDOFF REPORT  ED Nurse Name and Phone #: [phone]  S Name/Age/Gender Denise Calderon 85 y.o. female Room/Bed: ED08A/ED08A  Code Status   Code Status: Prior  Home/SNF/Other Skilled nursing facility Disoriented x3  No. This spoke with Ambulance personCourtney RN from [facility] [place] [facility], per RN pt is able to voice her needs. Pt is disoriented x3 a t this time.   Triage Complete: Triage complete  Chief Complaint abd pain ems  Triage Note Pt via EMS from the [facility] [place], with c/o upper abd pain today. Per EMS pt guarding LUQ abd. PT A&Ox3, VSS . PT hx of stroke to RT side    Allergies No Known Allergies  Level of Care/Admitting Diagnosis ED Disposition    ED Disposition Condition Comment   Admit  [facility]ospital [facility] [[id number]]  Level of Care: Med-Surg [16]  Covid Evaluation: Asymptomatic Screening Protocol (No Symptoms)  Diagnosis: Abdominal pain [[id number]][[id number]]  Admitting Physician: Sung AmabileSAKAI, ISAMI [[id number]]  Attending Physician: Sung AmabileSAKAI, ISAMI [id number]]  PT Class (Do Not Modify): Observation [104]  PT Acc Code (Do Not Modify): Observation [10022]       B Medical/Surgery History Past Medical History:  Diagnosis Date  . Anemia   . Aortic valve defect    Aortic valve replaced  . Hypertension   . Pacemaker   . Stroke (HCC)   . Vertigo    Past Surgical History:  Procedure Laterality Date  . aortic valve replaced    . CAROTID STENT Left   . CTR Left   . HIP FRACTURE SURGERY Right   . PACEMAKER LEAD REMOVAL N/A 10/29/2015   Procedure: PACEMAKER generator change out;  Surgeon: Marcina MillardAlexander Paraschos, MD;  Location: [facility] ORS;  Service: Cardiovascular;  Laterality: N/A;     A IV Location/Drains/Wounds Patient Lines/Drains/Airways Status   Active Line/Drains/Airways    Name:   Placement date:   Placement time:   Site:   Days:   Peripheral IV 11/30/18 Left Antecubital   11/30/18    1754    Antecubital   less than 1   External Urinary Catheter    10/23/16    0700    -   768          Intake/Output Last 24 hours No intake or output data in the 24 hours ending 11/30/18 2232  Labs/Imaging Results for orders placed or performed during the hospital encounter of 11/30/18 (from the past 48 hour(s))  Lipase, blood     Status: None   Collection Time: 11/30/18  4:45 PM  Result Value Ref Range   Lipase 34 11 - 51 U/L    Comment: Performed at [facility], [address] [place], [place] [address]  Comprehensive metabolic panel     Status: Abnormal   Collection Time: 11/30/18  4:45 PM  Result Value Ref Range   Sodium 140 135 - 145 mmol/L   Potassium 3.9 3.5 - 5.1 mmol/L   Chloride 104 98 - 111 mmol/L   CO2 28 22 - 32 mmol/L   Glucose, Bld 149 (H) 70 - 99 mg/dL   BUN 30 (H) 8 - 23 mg/dL   Creatinine, Ser 4.130.77 0.44 - 1.00 mg/dL   Calcium 9.4 8.9 - 24.410.3 mg/dL   Total Protein 7.4 6.5 - 8.1 g/dL   Albumin 3.8 3.5 - 5.0 g/dL   AST 22 15 - 41 U/L   ALT 14 0 - 44 U/L   Alkaline Phosphatase 82 38 -  126 U/L   Total Bilirubin 0.7 0.3 - 1.2 mg/dL   GFR calc non Af Amer >60 >60 mL/min   GFR calc Af Amer >60 >60 mL/min   Anion gap 8 5 - 15    Comment: Performed at [facility], [address] [place], [place] [address]  CBC     Status: None   Collection Time: 11/30/18  4:45 PM  Result Value Ref Range   WBC 10.4 4.0 - 10.5 K/uL   RBC 3.98 3.87 - 5.11 MIL/uL   Hemoglobin 12.6 12.0 - 15.0 g/dL   HCT 19.139.7 47.836.0 - 29.546.0 %   MCV 99.7 80.0 - 100.0 fL   MCH 31.7 26.0 - 34.0 pg   MCHC 31.7 30.0 - 36.0 g/dL   RDW 62.113.7 30.811.5 - 65.715.5 %   Platelets 241 150 - 400 K/uL   nRBC 0.0 0.0 - 0.2 %    Comment: Performed at [facility], [address] [place], [place] [address]  Troponin I (High Sensitivity)     Status: None   Collection Time: 11/30/18  5:46 PM  Result Value Ref Range   Troponin I (High Sensitivity) 10 <18 ng/L    Comment: (NOTE) Elevated high sensitivity troponin I (hsTnI) values and significant  changes  across serial measurements may suggest ACS but many other  chronic and acute conditions are known to elevate hsTnI results.  Refer to the "Links" section for chest pain algorithms and additional  guidance. Performed at [facility], [address] [place], [place] [address]   Troponin I (High Sensitivity)     Status: None   Collection Time: 11/30/18  6:55 PM  Result Value Ref Range   Troponin I (High Sensitivity) 10 <18 ng/L    Comment: (NOTE) Elevated high sensitivity troponin I (hsTnI) values and significant  changes across serial measurements may suggest ACS but many other  chronic and acute conditions are known to elevate hsTnI results.  Refer to the "Links" section for chest pain algorithms and additional  guidance. Performed at [facility], [address] [place], [place] [address]    Ct Abdomen Pelvis W Contrast  Addendum Date: 11/30/2018   ADDENDUM REPORT: 11/30/2018 19:56 ADDENDUM: These results were called by telephone at the time of interpretation on 11/30/2018 at 7:56 pm to Dr. Phineas SemenGRAYDON GOODMAN , who verbally acknowledged these results. Electronically Signed   By: Norva PavlovElizabeth  Brown M.D.   On: 11/30/2018 19:56   Addendum Date: 11/30/2018   ADDENDUM REPORT: 11/30/2018 19:50 ADDENDUM: Contracted, thick-walled gallbladder suspicious for acute cholecystitis. Recommend further evaluation with abdominal ultrasound. Electronically Signed   By: Norva PavlovElizabeth  Brown M.D.   On: 11/30/2018 19:50   Result Date: 11/30/2018 CLINICAL DATA:  Pt via EMS from the [facility] [address] [place], with c/o upper abd pain today. Per EMS pt guarding LUQ abd. EXAM: CT ABDOMEN AND PELVIS WITH CONTRAST TECHNIQUE: Multidetector CT imaging of the abdomen and pelvis was performed using the standard protocol following bolus administration of intravenous contrast. CONTRAST:  75mL OMNIPAQUE IOHEXOL 300 MG/ML  SOLN COMPARISON:  None. FINDINGS: Lower chest: There is bibasilar atelectasis. The heart is  enlarged. There are extensive coronary artery calcifications and calcifications of the aortic valve and mitral annulus. Pacemaker leads to the RIGHT atrium and coronary sinus. Hepatobiliary: Common bile duct is dilated, 1 point 2 centimeters. There is intrahepatic biliary duct dilatation, primarily involving the LEFT hepatic lobe. Gallbladder is contracted, with thickened and enhancing wall. No pericholecystic fluid. No calcified  stones are identified. Pancreas: Unremarkable. No pancreatic ductal dilatation or surrounding inflammatory changes. Spleen: Normal in size without focal abnormality. Adrenals/Urinary Tract: Adrenal glands are normal. No renal mass. Note is made of extrarenal pelvis on the RIGHT. Within the RIGHT renal pelvis there is a dependent 3 millimeter calcification. No ureteral obstruction. Urinary bladder shows dependent layering high attenuation foci along the RIGHT posterior aspect suggesting layering bladder stones. The visualized urethra is unremarkable. Stomach/Bowel: Small hiatal hernia. The stomach contains debris and is otherwise unremarkable. Small bowel loops are unremarkable. There is a moderate amount of stool throughout the colon. Numerous colonic diverticula are present. There is no acute diverticulitis. There is rectal wall thickening, a nonspecific and measuring 1.0 centimeters. Normal appendix. Vascular/Lymphatic: There is dense atherosclerotic calcification of the abdominal aorta not associated with aneurysm. No retroperitoneal or mesenteric adenopathy. Reproductive: Uterus and bilateral adnexa are unremarkable. Other: No abdominal wall hernia or abnormality. No abdominopelvic ascites. Musculoskeletal: Atrophy of the RIGHT iliopsoas muscles consistent with previous stroke. L1 compression fracture. Previous ORIF of the RIGHT hip. IMPRESSION: 1. Small layering bladder stones, measuring 2-3 millimeters. Small stone identified within the dependent portion of the RIGHT renal pelvis.  Findings are consistent with recently passed ureteral stones. There is no evidence for ureteral obstruction at this time. 2. Cardiomegaly and coronary artery disease. 3. Moderate stool burden. Nonspecific rectal wall thickening. Rectal lesion can not be excluded. Recommend correlation with physical exam. 4. Colonic diverticulosis. 5. Small hiatal hernia. 6. Atrophy of the RIGHT iliopsoas muscles consistent with previous stroke. 7. Coronary artery disease. 8. Aortic Atherosclerosis (ICD10-I70.0). Electronically Signed: By: Norva PavlovElizabeth  Brown M.D. On: 11/30/2018 19:45   Dg Chest Portable 1 View  Result Date: 11/30/2018 CLINICAL DATA:  Chest pain per ordering notes. Patient unable to give adequate history. Per ED notes- [facility] the [facility] [address], with c/o upper abd pain today. Per daughter, pt today was c/o chest and rib pain when "face-timing". EXAM: PORTABLE CHEST 1 VIEW COMPARISON:  10/21/2015 FINDINGS: Status post median sternotomy and valve replacement. Heart size is normal. The patient has a LEFT-sided transvenous pacemaker with leads to the RIGHT atrium and RIGHT ventricle. There is mild perihilar peribronchial thickening. Shallow lung inflation. Minimal RIGHT LOWER lobe atelectasis. No consolidations or pleural effusions. No evidence for pulmonary edema. Remote RIGHT humerus fracture. IMPRESSION: 1. Bronchitic changes. 2. Shallow inflation. 3. Minimal RIGHT LOWER lobe atelectasis. Electronically Signed   By: Norva PavlovElizabeth  Brown M.D.   On: 11/30/2018 18:19   [place] Abdomen Limited Ruq  Result Date: 11/30/2018 CLINICAL DATA:  85 year old female with abdominal pain. Right upper quadrant pain for 1 week. EXAM: ULTRASOUND ABDOMEN LIMITED RIGHT UPPER QUADRANT COMPARISON:  CT Abdomen and Pelvis earlier today. FINDINGS: Gallbladder: Small 6 millimeter gallstone identified in the gallbladder neck on image 3. There does appear to be mild generalized gallbladder wall thickening of 3-4 millimeters (image 14). No  pericholecystic fluid. No sonographic Murphy sign elicited. Common bile duct: Diameter: 7 millimeters, upper limits of normal to mildly increased. No filling defect identified within the visible duct (image 23). Liver: Mild central intrahepatic biliary ductal dilatation was more apparent by CT. Liver echogenicity within normal limits. No discrete liver lesion. Portal vein is patent on color Doppler imaging with normal direction of blood flow towards the liver. Other findings: Negative visible right kidney. IMPRESSION: 1. Positive for a 6 mm gallstone, and mild gallbladder wall thickening. However, no sonographic Murphy sign elicited to strongly indicate acute cholecystitis. 2. Mild intra-and extrahepatic  biliary ductal dilatation is seen by CT. No filling defect identified within the visible duct. 3. Overall, a Nuclear Medicine Hepatobiliary Scan may be most valuable at this point. Electronically Signed   By: Genevie Ann M.D.   On: 11/30/2018 20:54    Pending Labs Unresulted Labs (From admission, onward)    Start     Ordered   11/30/18 1643  Urinalysis, Complete w Microscopic  ONCE - STAT,   STAT     11/30/18 1643   Signed and Held  Basic metabolic panel  Daily,   R     Signed and Held   Signed and Held  Magnesium  Daily,   R     Signed and Held   Signed and Held  Phosphorus  Daily,   R     Signed and Held   Signed and Held  CBC  Daily,   R     Signed and Held   Signed and Held  Hepatic function panel  Daily,   R     Signed and Held          Vitals/Pain Today's Vitals   11/30/18 1930 11/30/18 2000 11/30/18 2030 11/30/18 2100  BP: (!) 163/68 (!) 167/71 (!) 151/62 (!) 160/56  Pulse: 85 68 72 71  Resp: (!) 30 20 (!) 28 (!) 21  Temp:      TempSrc:      SpO2: 97% 96% 92% 94%    Isolation Precautions No active isolations  Medications Medications  iohexol (OMNIPAQUE) 300 MG/ML solution 75 mL (75 mLs Intravenous Contrast Given 11/30/18 1903)    Mobility non-ambulatory Moderate fall  risk      R Recommendations: See Admitting Provider Note  Report given to:   Additional Notes:

## 2018-11-30 NOTE — ED Notes (Addendum)
Per daughther, pt today was c/o chest and rib pain when  "face timing".

## 2018-12-01 ENCOUNTER — Observation Stay: Payer: Medicare Other

## 2018-12-01 DIAGNOSIS — L8990 Pressure ulcer of unspecified site, unspecified stage: Secondary | ICD-10-CM | POA: Insufficient documentation

## 2018-12-01 LAB — HEPATIC FUNCTION PANEL
ALT: 12 U/L (ref 0–44)
AST: 19 U/L (ref 15–41)
Albumin: 3.6 g/dL (ref 3.5–5.0)
Alkaline Phosphatase: 74 U/L (ref 38–126)
Bilirubin, Direct: 0.1 mg/dL (ref 0.0–0.2)
Indirect Bilirubin: 0.7 mg/dL (ref 0.3–0.9)
Total Bilirubin: 0.8 mg/dL (ref 0.3–1.2)
Total Protein: 7 g/dL (ref 6.5–8.1)

## 2018-12-01 LAB — BASIC METABOLIC PANEL
Anion gap: 9 (ref 5–15)
BUN: 20 mg/dL (ref 8–23)
CO2: 25 mmol/L (ref 22–32)
Calcium: 9 mg/dL (ref 8.9–10.3)
Chloride: 107 mmol/L (ref 98–111)
Creatinine, Ser: 0.57 mg/dL (ref 0.44–1.00)
GFR calc Af Amer: >60 mL/min (ref >60)
GFR calc non Af Amer: >60 mL/min (ref >60)
Glucose, Bld: 119 mg/dL — ABNORMAL HIGH (ref 70–99)
Potassium: 4 mmol/L (ref 3.5–5.1)
Sodium: 141 mmol/L (ref 135–145)

## 2018-12-01 LAB — CBC
HCT: 38 % (ref 36.0–46.0)
Hemoglobin: 12.4 g/dL (ref 12.0–15.0)
MCH: 32 pg (ref 26.0–34.0)
MCHC: 32.6 g/dL (ref 30.0–36.0)
MCV: 98.2 fL (ref 80.0–100.0)
Platelets: 235 10*3/uL (ref 150–400)
RBC: 3.87 MIL/uL (ref 3.87–5.11)
RDW: 14.1 % (ref 11.5–15.5)
WBC: 9.1 10*3/uL (ref 4.0–10.5)
nRBC: 0 % (ref 0.0–0.2)

## 2018-12-01 LAB — MRSA PCR SCREENING: MRSA by PCR: NEGATIVE

## 2018-12-01 LAB — MAGNESIUM: Magnesium: 2.1 mg/dL (ref 1.7–2.4)

## 2018-12-01 LAB — PHOSPHORUS: Phosphorus: 2.3 mg/dL — ABNORMAL LOW (ref 2.5–4.6)

## 2018-12-01 LAB — SARS CORONAVIRUS 2 BY RT PCR (HOSPITAL ORDER, PERFORMED IN ~~LOC~~ HOSPITAL LAB): SARS Coronavirus 2: NEGATIVE

## 2018-12-01 MED ORDER — PANTOPRAZOLE SODIUM 40 MG PO TBEC
40.0000 mg | DELAYED_RELEASE_TABLET | Freq: Every day | ORAL | Status: DC
  Filled 2018-12-01: qty 1

## 2018-12-01 MED ORDER — ACETAMINOPHEN 500 MG PO TABS: 500.0000 mg | ORAL_TABLET | Freq: Four times a day (QID) | ORAL | Status: DC | PRN

## 2018-12-01 MED ORDER — CITALOPRAM HYDROBROMIDE 20 MG PO TABS
30.0000 mg | ORAL_TABLET | Freq: Every day | ORAL | Status: DC
  Filled 2018-12-01: qty 2

## 2018-12-01 MED ORDER — TECHNETIUM TC 99M MEBROFENIN IV KIT
5.0000 | PACK | Freq: Once | INTRAVENOUS | Status: AC | PRN
  Administered 2018-12-01: 5.622 via INTRAVENOUS

## 2018-12-01 MED ORDER — MECLIZINE HCL 25 MG PO TABS
25.0000 mg | ORAL_TABLET | Freq: Three times a day (TID) | ORAL | Status: DC | PRN
  Filled 2018-12-01: qty 1

## 2018-12-01 MED ORDER — AMLODIPINE BESYLATE 5 MG PO TABS
5.0000 mg | ORAL_TABLET | Freq: Every day | ORAL | Status: DC
  Administered 2018-12-02: 5 mg via ORAL
  Filled 2018-12-01: qty 1

## 2018-12-01 MED ORDER — ENOXAPARIN SODIUM 40 MG/0.4ML ~~LOC~~ SOLN
40.0000 mg | SUBCUTANEOUS | Status: DC
  Administered 2018-12-01: 40 mg via SUBCUTANEOUS
  Filled 2018-12-01: qty 0.4

## 2018-12-01 MED ORDER — OXYBUTYNIN CHLORIDE ER 5 MG PO TB24
10.0000 mg | ORAL_TABLET | Freq: Every day | ORAL | Status: DC
  Filled 2018-12-01: qty 2

## 2018-12-01 MED ORDER — CITALOPRAM HYDROBROMIDE 20 MG PO TABS: 20.0000 mg | ORAL_TABLET | Freq: Every day | ORAL | Status: DC

## 2018-12-01 MED ORDER — FLUTICASONE PROPIONATE 50 MCG/ACT NA SUSP
2.0000 | Freq: Every day | NASAL | Status: DC
  Filled 2018-12-01: qty 16

## 2018-12-01 MED ORDER — OCUVITE-LUTEIN PO CAPS
1.0000 | ORAL_CAPSULE | Freq: Every day | ORAL | Status: DC
  Filled 2018-12-01: qty 1

## 2018-12-01 MED ORDER — ONDANSETRON 4 MG PO TBDP: 4.0000 mg | ORAL_TABLET | Freq: Four times a day (QID) | ORAL | Status: DC | PRN

## 2018-12-01 MED ORDER — CALCIUM CARBONATE-VITAMIN D 500-200 MG-UNIT PO TABS
1.0000 | ORAL_TABLET | Freq: Two times a day (BID) | ORAL | Status: DC
  Filled 2018-12-01: qty 1

## 2018-12-01 MED ORDER — ACETAMINOPHEN 500 MG PO TABS: 1000.0000 mg | ORAL_TABLET | Freq: Three times a day (TID) | ORAL | Status: DC

## 2018-12-01 MED ORDER — LACTATED RINGERS IV SOLN
INTRAVENOUS | Status: DC
  Administered 2018-12-01: 01:00:00 via INTRAVENOUS

## 2018-12-01 MED ORDER — MIRTAZAPINE 15 MG PO TABS
15.0000 mg | ORAL_TABLET | Freq: Every day | ORAL | Status: DC
  Administered 2018-12-01: 22:00:00 15 mg via ORAL
  Filled 2018-12-01: qty 1

## 2018-12-01 MED ORDER — CLOPIDOGREL BISULFATE 75 MG PO TABS: 75.0000 mg | ORAL_TABLET | Freq: Every day | ORAL | Status: DC

## 2018-12-01 NOTE — Discharge Summary (Signed)
Physician Discharge Summary  Patient ID: REVECCA NACHTIGAL MRN: [id number] DOB/AGE: 1923/06/27 85 y.o.  Admit date: 11/30/2018 Discharge date: 12/01/2018  Admission Diagnoses: abdominal pain  Discharge Diagnoses:  Same as above  Discharged Condition: good  Hospital Course: admitted for abdominal pain.  Workup for possible acute chole.  HIDA negative.  Asymptomatic entire stay, so deemed appropriate for d/c and f/u as outpt if symptoms recur  Consults: None  Discharge Exam: Blood pressure (!) 158/87, pulse 87, temperature 98.5 F (36.9 C), temperature source Oral, resp. rate 20, height 5' 2.01" (1.575 m), weight 47.6 kg, SpO2 95 %. General appearance: alert, cooperative and no distress GI: soft, non-tender; bowel sounds normal; no masses,  no organomegaly  Disposition:  Discharge disposition: 03-Skilled Nursing Facility       Discharge Instructions    Discharge patient   Complete by: As directed    Discharge disposition: 03-Skilled [facility]   Discharge patient date: 12/01/2018     Allergies as of 12/01/2018   No Known Allergies     Medication List    STOP taking these medications   aspirin 81 MG EC tablet   atorvastatin 40 MG tablet Commonly known as: LIPITOR     TAKE these medications   amLODipine 2.5 MG tablet Commonly known as: NORVASC Take 2.5 mg by mouth daily.   Calcium-D 600-400 MG-UNIT Tabs Take 1 tablet by mouth 2 (two) times daily with a meal.   citalopram 20 MG tablet Commonly known as: CELEXA Take 30 mg by mouth daily.   clopidogrel 75 MG tablet Commonly known as: PLAVIX Take 75 mg by mouth daily.   fluticasone 50 MCG/ACT nasal spray Commonly known as: FLONASE Place 2 sprays into both nostrils daily.   HYDROcodone-acetaminophen 5-325 MG tablet Commonly known as: NORCO/VICODIN Take 1 tablet by mouth every 6 (six) hours as needed for moderate pain.   ICAPS AREDS FORMULA PO Take 1 tablet by mouth 2 (two) times daily.   Mapap  500 MG tablet Generic drug: acetaminophen Take 1,000 mg by mouth 3 (three) times daily. Pt is also able to take every six hours as needed for pain.   meclizine 25 MG tablet Commonly known as: ANTIVERT Take 25 mg by mouth 3 (three) times daily as needed for dizziness.   mirtazapine 15 MG tablet Commonly known as: REMERON Take 15 mg by mouth at bedtime.   omeprazole 20 MG capsule Commonly known as: PRILOSEC Take 30 mg by mouth daily.   ondansetron 4 MG disintegrating tablet Commonly known as: ZOFRAN-ODT Take 4 mg by mouth every 6 (six) hours as needed for nausea or vomiting.   oxybutynin 10 MG 24 hr tablet Commonly known as: DITROPAN-XL Take 10 mg by mouth daily.   pantoprazole 40 MG tablet Commonly known as: PROTONIX Take 40 mg by mouth daily.   Refresh Plus 0.5 % Soln Generic drug: carboxymethylcellulose 1 drop 3 (three) times daily as needed.   Voltaren 1 % Gel Generic drug: diclofenac sodium Apply topically 4 (four) times daily as needed.      Follow-up Information    Derinda Late, MD Follow up in 1 week(s).   Specialty: Family Medicine Why: for f/u from hospitalization Contact information: [address] [facility] [place] [phone]            Total time spent arranging discharge was >89min. Signed: Benjamine Sprague 12/01/2018, 8:15 PM

## 2018-12-01 NOTE — Progress Notes (Signed)
Subjective:  CC: Denise Calderon is a 85 y.o. female  Hospital stay day 0,   abdominal pain.  HPI: No reported issues overnight.  Pt more lucid this pm, answering questions.  ROS:  General: Denies weight loss, weight gain, fatigue, fevers, chills, and night sweats. Heart: Denies chest pain, palpitations, racing heart, irregular heartbeat, leg pain or swelling, and decreased activity tolerance. Respiratory: Denies breathing difficulty, shortness of breath, wheezing, cough, and sputum. GI: Denies change in appetite, heartburn, nausea, vomiting, constipation, diarrhea, and blood in stool. GU: Denies difficulty urinating, pain with urinating, urgency, frequency, blood in urine.   Objective:   Temp:  [98 F (36.7 C)-98.5 F (36.9 C)] 98.5 F (36.9 C) (07/17 1255) Pulse Rate:  [68-92] 87 (07/17 1255) Resp:  [16-30] 20 (07/17 1255) BP: (128-175)/(53-87) 158/87 (07/17 1255) SpO2:  [92 %-97 %] 95 % (07/17 1255) Weight:  [47.6 kg] 47.6 kg (07/17 0000)     Height: 5' 2.01" (157.5 cm) Weight: 47.6 kg BMI (Calculated): 19.19   Intake/Output this shift:   Intake/Output Summary (Last 24 hours) at 12/01/2018 1527 Last data filed at 12/01/2018 1401 Gross per 24 hour  Intake 617.83 ml  Output -  Net 617.83 ml    Constitutional :  alert, cooperative, appears stated age, no distress and slowed mentation  Respiratory:  clear to auscultation bilaterally  Cardiovascular:  regular rate and rhythm  Gastrointestinal: soft, non-tender; bowel sounds normal; no masses,  no organomegaly.   Skin: Cool and moist.   Psychiatric: Normal affect, non-agitated, not confused       LABS:  CMP Latest Ref Rng & Units 12/01/2018 11/30/2018 10/23/2016  Glucose 70 - 99 mg/dL 119(H) 149(H) 84  BUN 8 - 23 mg/dL 20 30(H) 12  Creatinine 0.44 - 1.00 mg/dL 0.57 0.77 0.63  Sodium 135 - 145 mmol/L 141 140 138  Potassium 3.5 - 5.1 mmol/L 4.0 3.9 3.7  Chloride 98 - 111 mmol/L 107 104 100(L)  CO2 22 - 32 mmol/L 25 28 29    Calcium 8.9 - 10.3 mg/dL 9.0 9.4 9.0  Total Protein 6.5 - 8.1 g/dL 7.0 7.4 7.2  Total Bilirubin 0.3 - 1.2 mg/dL 0.8 0.7 0.7  Alkaline Phos 38 - 126 U/L 74 82 81  AST 15 - 41 U/L 19 22 25   ALT 0 - 44 U/L 12 14 12(L)   CBC Latest Ref Rng & Units 12/01/2018 11/30/2018 10/23/2016  WBC 4.0 - 10.5 K/uL 9.1 10.4 10.0  Hemoglobin 12.0 - 15.0 g/dL 12.4 12.6 13.4  Hematocrit 36.0 - 46.0 % 38.0 39.7 39.0  Platelets 150 - 400 K/uL 235 241 215    RADS: Pending HIDA Assessment:   Chest pain/right lower rib pain Possible gallbladder wall thickening based on imaging studies concerning for cholecystitis History of CVA on Plavix History of AV block pacemaker placement Hypertension Hyperlipidemia  Pending HIDA.  Pt states she had pain in epigastric region, but physical exam unremarkable.  Labs WNL.  Will likely d/c if HIDA is negative and she can f/u with primary.  Need to discuss full r/b/a of surgery if HIDA is positive, especially now that she is pain free without any meds.  Continue home meds for other comorbidities in the meantime

## 2018-12-02 LAB — BASIC METABOLIC PANEL
Anion gap: 11 (ref 5–15)
BUN: 13 mg/dL (ref 8–23)
CO2: 27 mmol/L (ref 22–32)
Calcium: 9.1 mg/dL (ref 8.9–10.3)
Chloride: 103 mmol/L (ref 98–111)
Creatinine, Ser: 0.48 mg/dL (ref 0.44–1.00)
GFR calc Af Amer: >60 mL/min (ref >60)
GFR calc non Af Amer: >60 mL/min (ref >60)
Glucose, Bld: 125 mg/dL — ABNORMAL HIGH (ref 70–99)
Potassium: 4.4 mmol/L (ref 3.5–5.1)
Sodium: 141 mmol/L (ref 135–145)

## 2018-12-02 LAB — CBC WITH DIFFERENTIAL/PLATELET
Abs Immature Granulocytes: 0.03 10*3/uL (ref 0.00–0.07)
Basophils Absolute: 0 10*3/uL (ref 0.0–0.1)
Basophils Relative: 0 %
Eosinophils Absolute: 0.1 10*3/uL (ref 0.0–0.5)
Eosinophils Relative: 1 %
HCT: 42.1 % (ref 36.0–46.0)
Hemoglobin: 13.3 g/dL (ref 12.0–15.0)
Immature Granulocytes: 0 %
Lymphocytes Relative: 27 %
Lymphs Abs: 2.4 10*3/uL (ref 0.7–4.0)
MCH: 31.7 pg (ref 26.0–34.0)
MCHC: 31.6 g/dL (ref 30.0–36.0)
MCV: 100.5 fL — ABNORMAL HIGH (ref 80.0–100.0)
Monocytes Absolute: 0.8 10*3/uL (ref 0.1–1.0)
Monocytes Relative: 9 %
Neutro Abs: 5.6 10*3/uL (ref 1.7–7.7)
Neutrophils Relative %: 63 %
Platelets: 262 10*3/uL (ref 150–400)
RBC: 4.19 MIL/uL (ref 3.87–5.11)
RDW: 14 % (ref 11.5–15.5)
WBC: 9 10*3/uL (ref 4.0–10.5)
nRBC: 0 % (ref 0.0–0.2)

## 2018-12-02 LAB — GLUCOSE, CAPILLARY: Glucose-Capillary: 120 mg/dL — ABNORMAL HIGH (ref 70–99)

## 2018-12-02 LAB — NOVEL CORONAVIRUS, NAA (HOSP ORDER, SEND-OUT TO REF LAB; TAT 18-24 HRS): SARS-CoV-2, NAA: NOT DETECTED

## 2018-12-02 MED ORDER — HYDRALAZINE HCL 20 MG/ML IJ SOLN
5.0000 mg | Freq: Once | INTRAMUSCULAR | Status: AC
  Administered 2018-12-02: 5 mg via INTRAVENOUS
  Filled 2018-12-02: qty 1

## 2018-12-02 NOTE — Progress Notes (Signed)
12/02/2018 10:45 AM  Spoke with the patient's daughter on the phone and answered all relevant questions about the plan and potential disharge.  Agreed to call again later in the day closer to discharge.  Dola Argyle, RN

## 2018-12-02 NOTE — NC FL2 (Signed)
[place] LEVEL OF CARE SCREENING TOOL     IDENTIFICATION  Patient Name: Denise Calderon Birthdate: [birth year] Sex: female Admission Date (Current Location): 11/30/2018  [facility] and [place] Number:  Engineering geologist and Address:         Provider Number: [id number]  Attending Physician Name and Address:  Benjamine Sprague, DO  Relative Name and Phone Number:       Current Level of Care:   Recommended Level of Care: [place] Prior Approval Number:    Date Approved/Denied:   PASRR Number:    Discharge Plan: SNF    Current Diagnoses: Patient Active Problem List   Diagnosis Date Noted  . Pressure injury of skin 12/01/2018  . Abdominal pain 11/30/2018  . TIA (transient ischemic attack) 10/23/2016  . Carotid artery occlusion with infarction ([place]) 02/26/2016  . CVA (cerebral vascular accident) ([place]) 02/26/2016  . Essential hypertension 02/26/2016  . Hyperlipidemia 02/26/2016  . Benign paroxysmal positional vertigo 02/26/2016  . Altered mental status 04/30/2015    Orientation RESPIRATION BLADDER Height & Weight     Self, Time  Normal Incontinent Weight: 47.6 kg Height:  5' 2.01" (157.5 cm)  BEHAVIORAL SYMPTOMS/MOOD NEUROLOGICAL BOWEL NUTRITION STATUS      Continent Diet  AMBULATORY STATUS COMMUNICATION OF NEEDS Skin   Supervision Verbally Normal                       Personal Care Assistance Level of Assistance  Bathing, Dressing, Total care, Feeding Bathing Assistance: Limited assistance Feeding assistance: Independent Dressing Assistance: Limited assistance Total Care Assistance: Limited assistance   Functional Limitations Info  Sight, Speech, Hearing Sight Info: Adequate Hearing Info: Adequate Speech Info: Adequate    SPECIAL CARE FACTORS FREQUENCY  PT (By licensed PT), OT (By licensed OT)     PT Frequency: to resume if she was receieving before OT Frequency: to resume if she was receeving before             Contractures Contractures Info: Not present    Additional Factors Info  Code Status, Allergies Code Status Info: DNR Allergies Info: none           Current Medications (12/02/2018):  This is the current hospital active medication list Current Facility-Administered Medications  Medication Dose Route Frequency Provider Last Rate Last Dose  . acetaminophen (TYLENOL) tablet 500 mg  500 mg Oral Q6H PRN Lysle Pearl, Isami, DO      . amLODipine (NORVASC) tablet 5 mg  5 mg Oral Daily [place], Isami, DO   Stopped at 12/02/18 0914  . calcium-vitamin D (OSCAL WITH D) 500-200 MG-UNIT per tablet 1 tablet  1 tablet Oral BID WC Sakai, Isami, DO   Stopped at 12/02/18 0914  . citalopram (CELEXA) tablet 30 mg  30 mg Oral Daily Sakai, Isami, DO   Stopped at 12/02/18 0914  . fluticasone (FLONASE) 50 MCG/ACT nasal spray 2 spray  2 spray Each Nare Daily Sakai, Isami, DO   Stopped at 12/02/18 0915  . hydrALAZINE (APRESOLINE) injection 5 mg  5 mg Intravenous Once Sakai, Isami, DO      . meclizine (ANTIVERT) tablet 25 mg  25 mg Oral TID PRN Lysle Pearl, Isami, DO      . mirtazapine (REMERON) tablet 15 mg  15 mg Oral QHS Sakai, Isami, DO   15 mg at 12/01/18 2155  . multivitamin-lutein (OCUVITE-LUTEIN) capsule 1 capsule  1 capsule Oral Daily Sakai, Isami, DO   Stopped at  12/02/18 0915  . ondansetron (ZOFRAN-ODT) disintegrating tablet 4 mg  4 mg Oral Q6H PRN Tonna BoehringerSakai, Isami, DO      . oxybutynin (DITROPAN-XL) 24 hr tablet 10 mg  10 mg Oral Daily Sakai, Isami, DO   Stopped at 12/02/18 0915  . pantoprazole (PROTONIX) EC tablet 40 mg  40 mg Oral Daily Sakai, Isami, DO   Stopped at 12/02/18 0915     Discharge Medications: Medication List    STOP taking these medications   aspirin 81 MG EC tablet   atorvastatin 40 MG tablet Commonly known as: LIPITOR     TAKE these medications   amLODipine 2.5 MG tablet Commonly known as: NORVASC Take 2.5 mg by mouth daily.   Calcium-D 600-400 MG-UNIT Tabs Take 1 tablet by  mouth 2 (two) times daily with a meal.   citalopram 20 MG tablet Commonly known as: CELEXA Take 30 mg by mouth daily.   clopidogrel 75 MG tablet Commonly known as: PLAVIX Take 75 mg by mouth daily.   fluticasone 50 MCG/ACT nasal spray Commonly known as: FLONASE Place 2 sprays into both nostrils daily.   HYDROcodone-acetaminophen 5-325 MG tablet Commonly known as: NORCO/VICODIN Take 1 tablet by mouth every 6 (six) hours as needed for moderate pain.   ICAPS AREDS FORMULA PO Take 1 tablet by mouth 2 (two) times daily.   Mapap 500 MG tablet Generic drug: acetaminophen Take 1,000 mg by mouth 3 (three) times daily. Pt is also able to take every six hours as needed for pain.   meclizine 25 MG tablet Commonly known as: ANTIVERT Take 25 mg by mouth 3 (three) times daily as needed for dizziness.   mirtazapine 15 MG tablet Commonly known as: REMERON Take 15 mg by mouth at bedtime.   omeprazole 20 MG capsule Commonly known as: PRILOSEC Take 30 mg by mouth daily.   ondansetron 4 MG disintegrating tablet Commonly known as: ZOFRAN-ODT Take 4 mg by mouth every 6 (six) hours as needed for nausea or vomiting.   oxybutynin 10 MG 24 hr tablet Commonly known as: DITROPAN-XL Take 10 mg by mouth daily.   pantoprazole 40 MG tablet Commonly known as: PROTONIX Take 40 mg by mouth daily.   Refresh Plus 0.5 % Soln Generic drug: carboxymethylcellulose 1 drop 3 (three) times daily as needed.   Voltaren 1 % Gel Generic drug: diclofenac sodium Apply topically 4 (four) times daily as needed.        Relevant Imaging Results:  Relevant Lab Results:   Additional Information    Thorsten Climer Johny ShockA Laverta Harnisch, RN

## 2019-06-18 DEATH — deceased

## 2019-11-23 IMAGING — DX PORTABLE CHEST - 1 VIEW
1 series · 1 of 1 positions shown · non-contrast
Comparison: 10/21/2015

CLINICAL DATA: Chest pain per ordering notes. Patient unable to
give adequate history. Per ED notes- Pt via EMS from [REDACTED], with c/o upper abd pain today. Per daughter, pt today was
c/o chest and rib pain when "face-timing".

EXAM:
PORTABLE CHEST 1 VIEW

[chest ap]
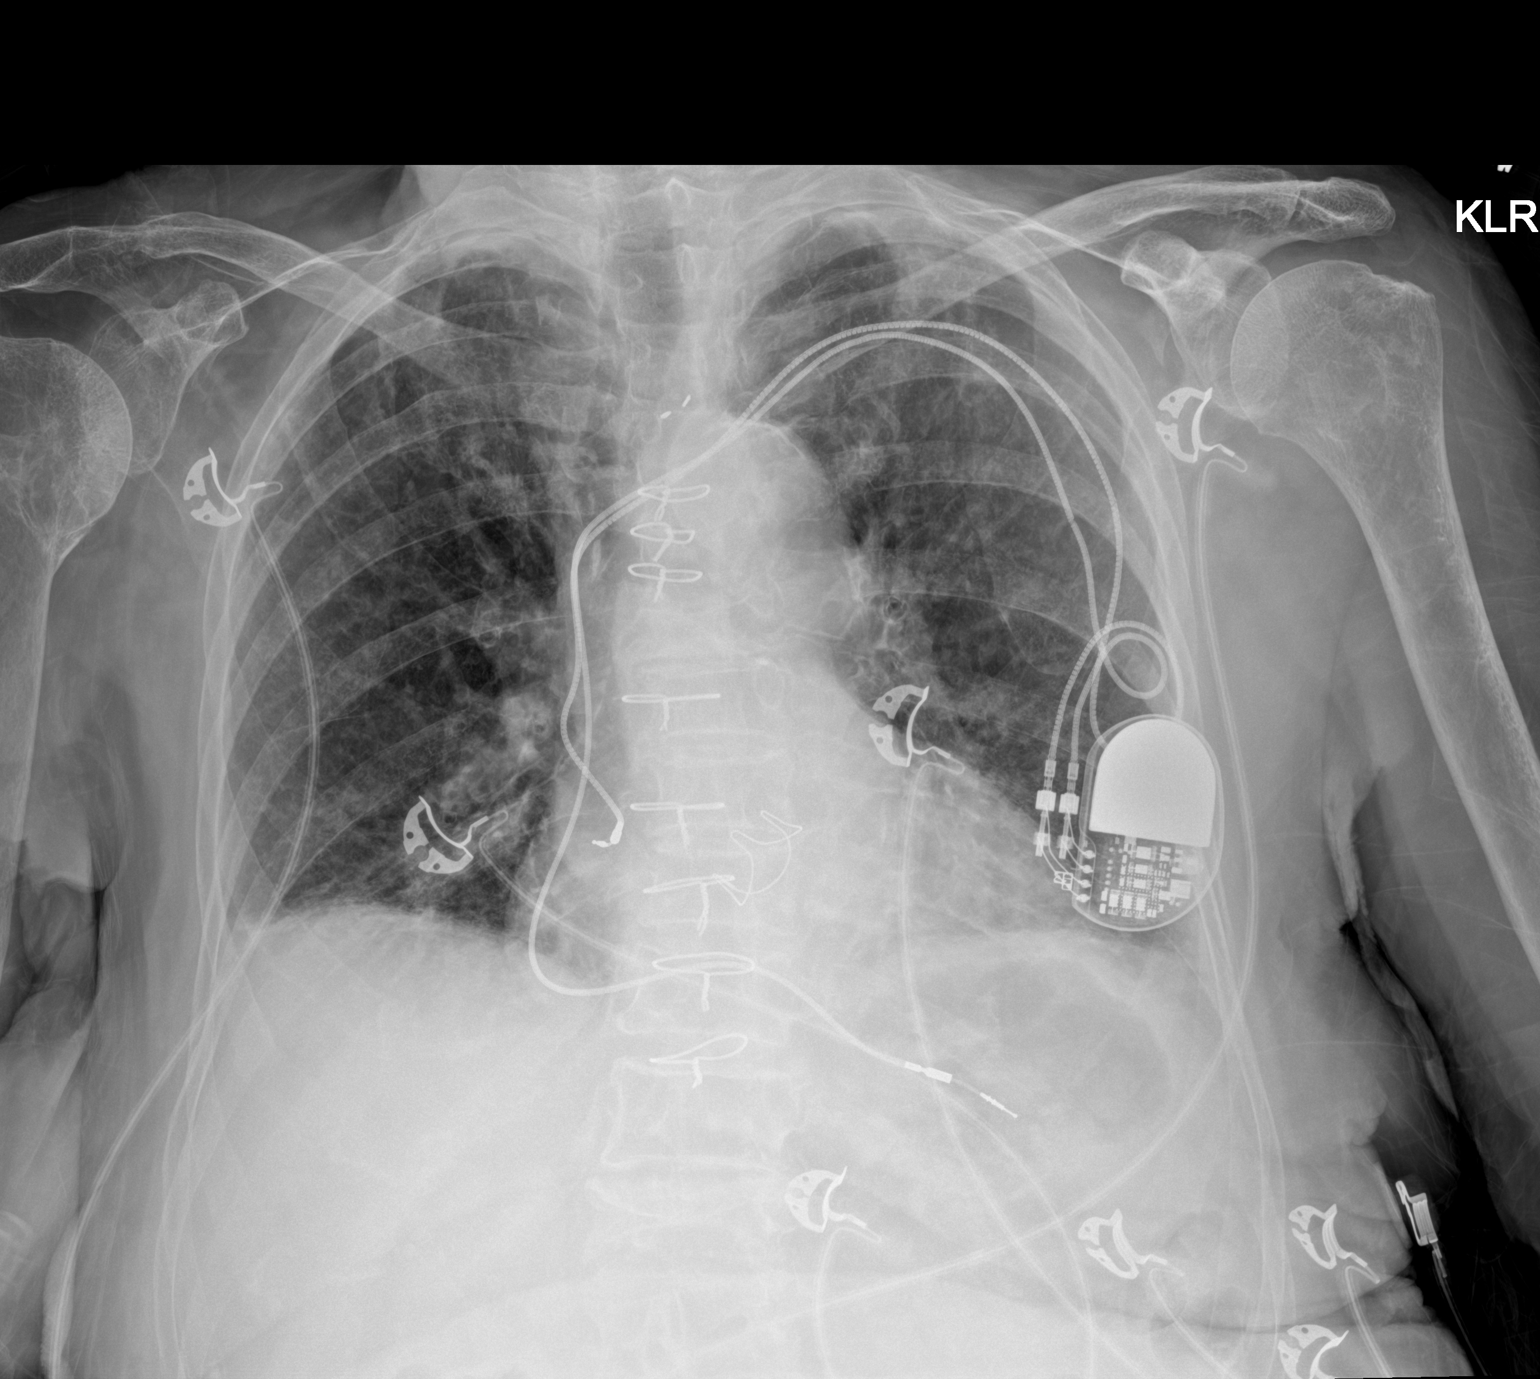

[1 of 1 positions shown; findings below may reference images not displayed]

FINDINGS: Status post median sternotomy and valve replacement. Heart size is
normal. The patient has a LEFT-sided transvenous pacemaker with
leads to the RIGHT atrium and RIGHT ventricle. There is mild
perihilar peribronchial thickening. Shallow lung inflation. Minimal
RIGHT LOWER lobe atelectasis. No consolidations or pleural
effusions. No evidence for pulmonary edema. Remote RIGHT humerus
fracture.
IMPRESSION: 1. Bronchitic changes.
2. Shallow inflation.
3. Minimal RIGHT LOWER lobe atelectasis.
